# Patient Record
Sex: Female | Born: 1975 | Race: White | Hispanic: No | Marital: Married | Smoking: Never smoker
Health system: Southern US, Community
[De-identification: ages and names within clinical notes are randomized; demographics above are authoritative.]

## PROBLEM LIST (undated history)

## (undated) DIAGNOSIS — D869 Sarcoidosis, unspecified: Secondary | ICD-10-CM

## (undated) DIAGNOSIS — I10 Essential (primary) hypertension: Secondary | ICD-10-CM

## (undated) DIAGNOSIS — D693 Immune thrombocytopenic purpura: Secondary | ICD-10-CM

## (undated) DIAGNOSIS — O1420 HELLP syndrome (HELLP), unspecified trimester: Secondary | ICD-10-CM

## (undated) HISTORY — DX: HELLP syndrome (HELLP), unspecified trimester: O14.20

## (undated) HISTORY — PX: BONE MARROW BIOPSY: SHX199

## (undated) HISTORY — DX: Sarcoidosis, unspecified: D86.9

---

## 2000-09-11 ENCOUNTER — Encounter: Payer: Self-pay | Admitting: Thoracic Surgery

## 2000-09-13 ENCOUNTER — Ambulatory Visit (HOSPITAL_COMMUNITY): Admission: RE | Admit: 2000-09-13 | Discharge: 2000-09-13 | Payer: Self-pay | Admitting: Thoracic Surgery

## 2000-09-13 ENCOUNTER — Encounter (INDEPENDENT_AMBULATORY_CARE_PROVIDER_SITE_OTHER): Payer: Self-pay | Admitting: *Deleted

## 2001-02-27 ENCOUNTER — Other Ambulatory Visit: Admission: RE | Admit: 2001-02-27 | Discharge: 2001-02-27 | Payer: Self-pay | Admitting: Gynecology

## 2001-06-17 ENCOUNTER — Encounter: Payer: Self-pay | Admitting: *Deleted

## 2001-06-17 ENCOUNTER — Ambulatory Visit (HOSPITAL_COMMUNITY): Admission: RE | Admit: 2001-06-17 | Discharge: 2001-06-17 | Payer: Self-pay | Admitting: *Deleted

## 2002-04-10 ENCOUNTER — Other Ambulatory Visit: Admission: RE | Admit: 2002-04-10 | Discharge: 2002-04-10 | Payer: Self-pay | Admitting: Gynecology

## 2002-06-18 ENCOUNTER — Encounter: Payer: Self-pay | Admitting: Family Medicine

## 2002-06-18 ENCOUNTER — Ambulatory Visit (HOSPITAL_COMMUNITY): Admission: RE | Admit: 2002-06-18 | Discharge: 2002-06-18 | Payer: Self-pay | Admitting: Family Medicine

## 2002-11-02 ENCOUNTER — Other Ambulatory Visit: Admission: RE | Admit: 2002-11-02 | Discharge: 2002-11-02 | Payer: Self-pay | Admitting: Gynecology

## 2003-03-23 ENCOUNTER — Encounter: Admission: RE | Admit: 2003-03-23 | Discharge: 2003-03-23 | Payer: Self-pay | Admitting: Gynecology

## 2003-04-22 ENCOUNTER — Inpatient Hospital Stay (HOSPITAL_COMMUNITY): Admission: AD | Admit: 2003-04-22 | Discharge: 2003-04-22 | Payer: Self-pay | Admitting: Gynecology

## 2003-05-13 ENCOUNTER — Inpatient Hospital Stay (HOSPITAL_COMMUNITY): Admission: AD | Admit: 2003-05-13 | Discharge: 2003-05-18 | Payer: Self-pay | Admitting: Gynecology

## 2003-05-14 ENCOUNTER — Encounter (INDEPENDENT_AMBULATORY_CARE_PROVIDER_SITE_OTHER): Payer: Self-pay

## 2003-05-18 ENCOUNTER — Encounter: Admission: RE | Admit: 2003-05-18 | Discharge: 2003-06-17 | Payer: Self-pay | Admitting: Gynecology

## 2003-06-30 ENCOUNTER — Other Ambulatory Visit: Admission: RE | Admit: 2003-06-30 | Discharge: 2003-06-30 | Payer: Self-pay | Admitting: Gynecology

## 2004-02-04 ENCOUNTER — Ambulatory Visit (HOSPITAL_COMMUNITY): Admission: RE | Admit: 2004-02-04 | Discharge: 2004-02-04 | Payer: Self-pay | Admitting: Family Medicine

## 2004-06-30 ENCOUNTER — Other Ambulatory Visit: Admission: RE | Admit: 2004-06-30 | Discharge: 2004-06-30 | Payer: Self-pay | Admitting: Gynecology

## 2005-07-05 ENCOUNTER — Ambulatory Visit (HOSPITAL_COMMUNITY): Admission: RE | Admit: 2005-07-05 | Discharge: 2005-07-05 | Payer: Self-pay | Admitting: Family Medicine

## 2005-07-05 ENCOUNTER — Other Ambulatory Visit: Admission: RE | Admit: 2005-07-05 | Discharge: 2005-07-05 | Payer: Self-pay | Admitting: Gynecology

## 2005-07-10 ENCOUNTER — Ambulatory Visit (HOSPITAL_COMMUNITY): Admission: RE | Admit: 2005-07-10 | Discharge: 2005-07-10 | Payer: Self-pay | Admitting: Gynecology

## 2005-08-14 ENCOUNTER — Ambulatory Visit (HOSPITAL_COMMUNITY): Payer: Self-pay | Admitting: Oncology

## 2005-08-14 ENCOUNTER — Encounter: Admission: RE | Admit: 2005-08-14 | Discharge: 2005-09-07 | Payer: Self-pay | Admitting: Oncology

## 2005-08-14 ENCOUNTER — Encounter (HOSPITAL_COMMUNITY): Admission: RE | Admit: 2005-08-14 | Discharge: 2005-09-07 | Payer: Self-pay | Admitting: Oncology

## 2006-03-28 ENCOUNTER — Other Ambulatory Visit: Admission: RE | Admit: 2006-03-28 | Discharge: 2006-03-28 | Payer: Self-pay | Admitting: Gynecology

## 2006-08-13 ENCOUNTER — Encounter: Admission: RE | Admit: 2006-08-13 | Discharge: 2006-09-06 | Payer: Self-pay | Admitting: Oncology

## 2006-08-13 ENCOUNTER — Encounter (HOSPITAL_COMMUNITY): Admission: RE | Admit: 2006-08-13 | Discharge: 2006-09-06 | Payer: Self-pay | Admitting: Oncology

## 2006-08-13 ENCOUNTER — Ambulatory Visit (HOSPITAL_COMMUNITY): Payer: Self-pay | Admitting: Oncology

## 2006-09-10 ENCOUNTER — Encounter: Admission: RE | Admit: 2006-09-10 | Discharge: 2006-09-10 | Payer: Self-pay | Admitting: Oncology

## 2006-09-10 ENCOUNTER — Encounter (HOSPITAL_COMMUNITY): Admission: RE | Admit: 2006-09-10 | Discharge: 2006-10-10 | Payer: Self-pay | Admitting: Oncology

## 2006-10-01 ENCOUNTER — Ambulatory Visit (HOSPITAL_COMMUNITY): Payer: Self-pay | Admitting: Oncology

## 2006-10-10 ENCOUNTER — Inpatient Hospital Stay (HOSPITAL_COMMUNITY): Admission: AD | Admit: 2006-10-10 | Discharge: 2006-10-13 | Payer: Self-pay | Admitting: Gynecology

## 2006-10-14 ENCOUNTER — Encounter: Admission: RE | Admit: 2006-10-14 | Discharge: 2006-11-12 | Payer: Self-pay | Admitting: Gynecology

## 2006-11-21 ENCOUNTER — Other Ambulatory Visit: Admission: RE | Admit: 2006-11-21 | Discharge: 2006-11-21 | Payer: Self-pay | Admitting: Gynecology

## 2007-07-28 ENCOUNTER — Ambulatory Visit (HOSPITAL_COMMUNITY): Payer: Self-pay | Admitting: Oncology

## 2007-07-28 ENCOUNTER — Encounter (HOSPITAL_COMMUNITY): Admission: RE | Admit: 2007-07-28 | Discharge: 2007-08-27 | Payer: Self-pay | Admitting: Oncology

## 2007-12-16 ENCOUNTER — Encounter (HOSPITAL_COMMUNITY): Admission: RE | Admit: 2007-12-16 | Discharge: 2008-01-15 | Payer: Self-pay | Admitting: Oncology

## 2007-12-16 ENCOUNTER — Ambulatory Visit (HOSPITAL_COMMUNITY): Payer: Self-pay | Admitting: Oncology

## 2008-02-10 ENCOUNTER — Encounter (HOSPITAL_COMMUNITY): Admission: RE | Admit: 2008-02-10 | Discharge: 2008-03-11 | Payer: Self-pay | Admitting: Oncology

## 2008-02-10 ENCOUNTER — Ambulatory Visit (HOSPITAL_COMMUNITY): Payer: Self-pay | Admitting: Oncology

## 2008-04-08 ENCOUNTER — Encounter (HOSPITAL_COMMUNITY): Admission: RE | Admit: 2008-04-08 | Discharge: 2008-05-08 | Payer: Self-pay | Admitting: Oncology

## 2008-04-08 ENCOUNTER — Ambulatory Visit (HOSPITAL_COMMUNITY): Payer: Self-pay | Admitting: Oncology

## 2008-04-22 ENCOUNTER — Ambulatory Visit (HOSPITAL_COMMUNITY): Admission: RE | Admit: 2008-04-22 | Discharge: 2008-04-22 | Payer: Self-pay | Admitting: Family Medicine

## 2008-05-14 ENCOUNTER — Encounter (HOSPITAL_COMMUNITY): Admission: RE | Admit: 2008-05-14 | Discharge: 2008-05-14 | Payer: Self-pay | Admitting: Oncology

## 2008-05-27 ENCOUNTER — Inpatient Hospital Stay (HOSPITAL_COMMUNITY): Admission: AD | Admit: 2008-05-27 | Discharge: 2008-06-25 | Payer: Self-pay | Admitting: Obstetrics & Gynecology

## 2008-05-27 ENCOUNTER — Encounter: Payer: Self-pay | Admitting: Obstetrics & Gynecology

## 2008-06-01 ENCOUNTER — Encounter: Payer: Self-pay | Admitting: Obstetrics & Gynecology

## 2008-06-04 ENCOUNTER — Encounter: Payer: Self-pay | Admitting: Obstetrics & Gynecology

## 2008-06-08 ENCOUNTER — Encounter: Payer: Self-pay | Admitting: Obstetrics & Gynecology

## 2008-06-10 ENCOUNTER — Encounter: Payer: Self-pay | Admitting: Obstetrics & Gynecology

## 2008-06-15 ENCOUNTER — Encounter: Payer: Self-pay | Admitting: Obstetrics & Gynecology

## 2008-06-18 ENCOUNTER — Encounter: Payer: Self-pay | Admitting: Obstetrics & Gynecology

## 2008-06-22 ENCOUNTER — Encounter: Payer: Self-pay | Admitting: Obstetrics & Gynecology

## 2008-06-24 ENCOUNTER — Ambulatory Visit (HOSPITAL_COMMUNITY): Admission: RE | Admit: 2008-06-24 | Discharge: 2008-06-24 | Payer: Self-pay | Admitting: Obstetrics & Gynecology

## 2008-07-01 ENCOUNTER — Inpatient Hospital Stay (HOSPITAL_COMMUNITY): Admission: AD | Admit: 2008-07-01 | Discharge: 2008-07-03 | Payer: Self-pay | Admitting: Obstetrics and Gynecology

## 2008-07-01 ENCOUNTER — Encounter (INDEPENDENT_AMBULATORY_CARE_PROVIDER_SITE_OTHER): Payer: Self-pay | Admitting: Obstetrics & Gynecology

## 2008-09-06 ENCOUNTER — Ambulatory Visit (HOSPITAL_COMMUNITY): Payer: Self-pay | Admitting: Oncology

## 2008-09-06 ENCOUNTER — Encounter (HOSPITAL_COMMUNITY): Admission: RE | Admit: 2008-09-06 | Discharge: 2008-10-06 | Payer: Self-pay | Admitting: Oncology

## 2008-10-19 ENCOUNTER — Encounter (HOSPITAL_COMMUNITY): Admission: RE | Admit: 2008-10-19 | Discharge: 2008-11-18 | Payer: Self-pay | Admitting: Oncology

## 2008-11-11 ENCOUNTER — Ambulatory Visit (HOSPITAL_COMMUNITY): Payer: Self-pay | Admitting: Oncology

## 2008-11-29 ENCOUNTER — Encounter (HOSPITAL_COMMUNITY): Admission: RE | Admit: 2008-11-29 | Discharge: 2008-12-29 | Payer: Self-pay | Admitting: Oncology

## 2009-03-08 ENCOUNTER — Ambulatory Visit (HOSPITAL_COMMUNITY): Payer: Self-pay | Admitting: Oncology

## 2009-03-08 ENCOUNTER — Encounter (HOSPITAL_COMMUNITY): Admission: RE | Admit: 2009-03-08 | Discharge: 2009-04-08 | Payer: Self-pay | Admitting: Oncology

## 2009-05-20 ENCOUNTER — Ambulatory Visit (HOSPITAL_COMMUNITY): Payer: Self-pay | Admitting: Oncology

## 2009-05-20 ENCOUNTER — Encounter (HOSPITAL_COMMUNITY): Admission: RE | Admit: 2009-05-20 | Discharge: 2009-06-19 | Payer: Self-pay | Admitting: Oncology

## 2010-01-15 IMAGING — US US FETAL BPP W/O NONSTRESS
1 series · 14 of 21 positions shown · non-contrast
Comparison: none

OBSTETRICAL ULTRASOUND:
 This ultrasound was performed in The [HOSPITAL], and the [facility] report will be stored to [REDACTED] PACS.

[Series 1: us fetal bpp w/o nonstress · 14 of 21 slices shown]
[im 1/21]
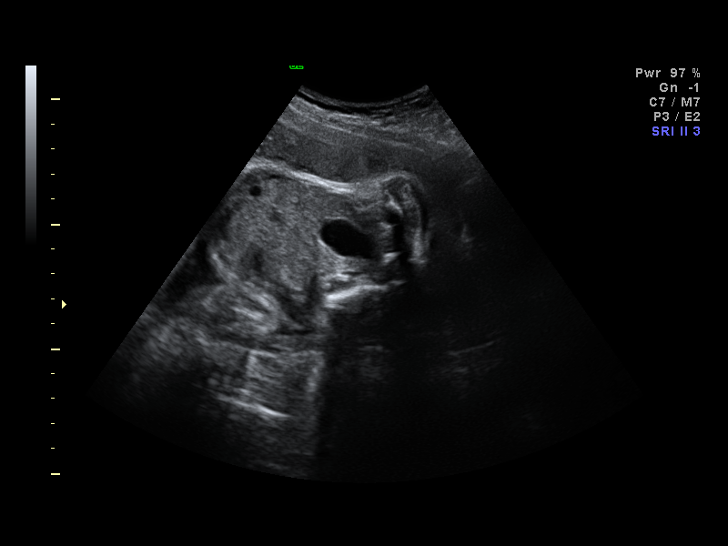
[im 3/21]
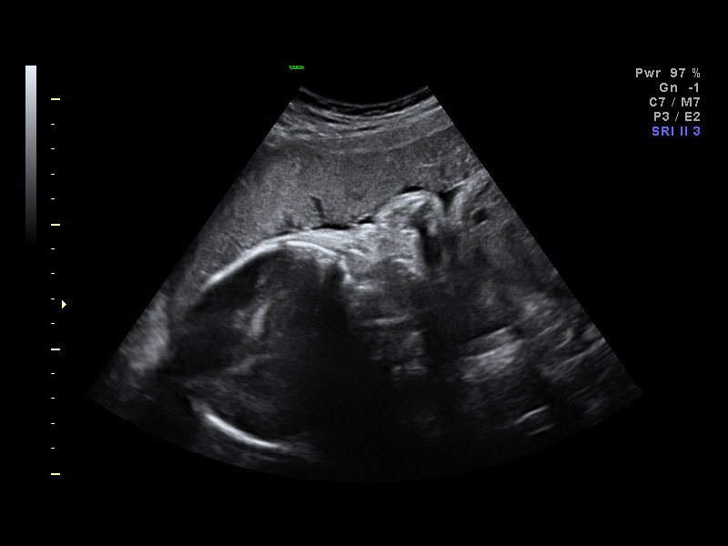
[im 4/21]
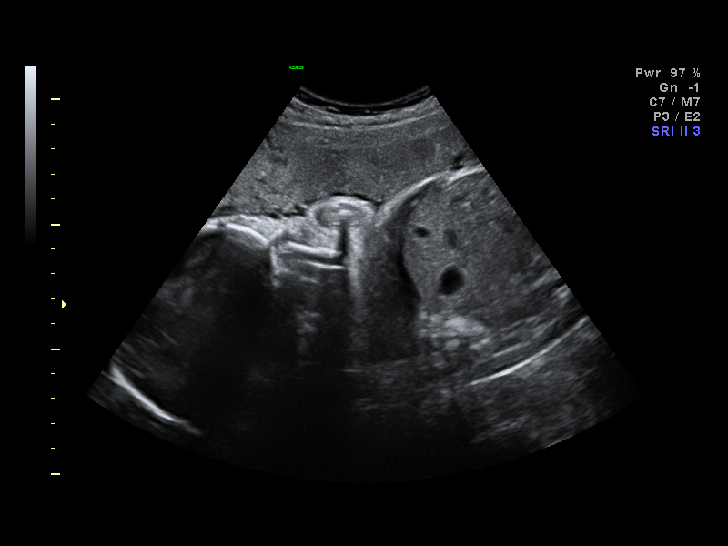
[im 6/21]
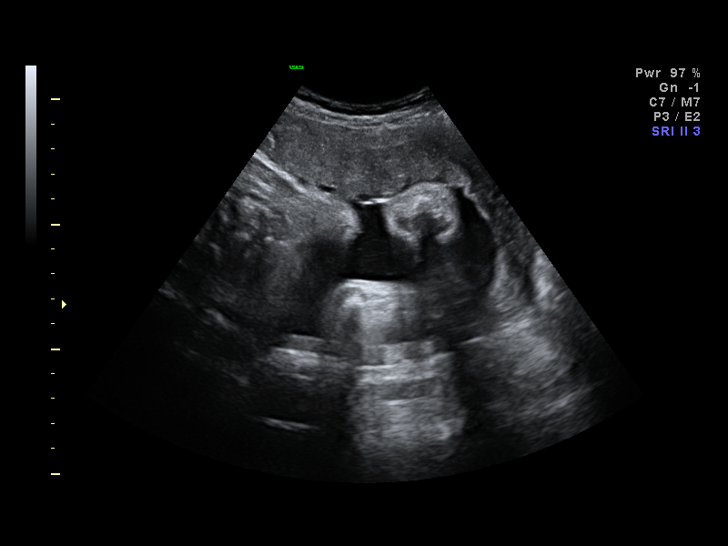
[im 7/21]
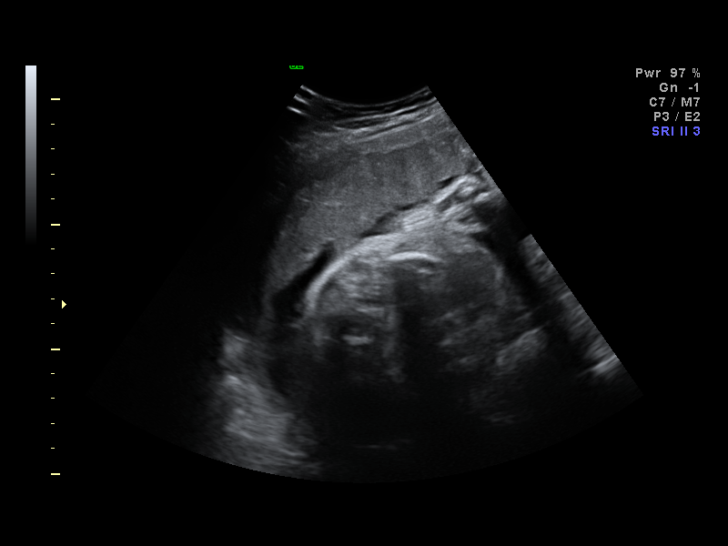
[im 9/21]
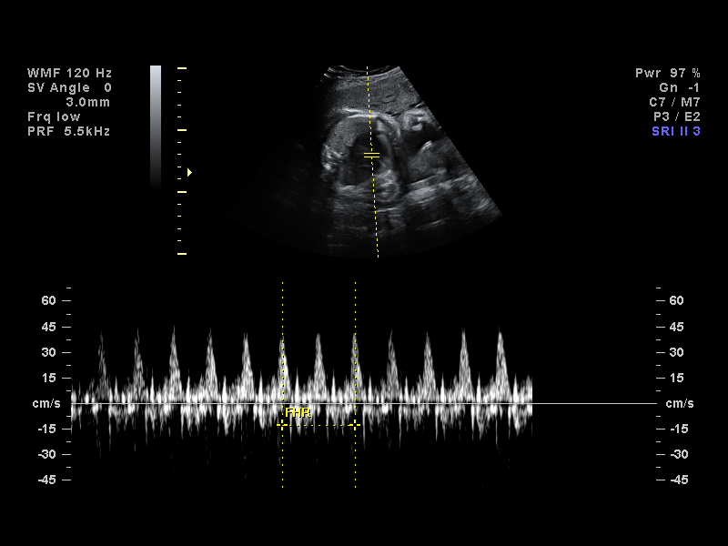
[im 10/21]
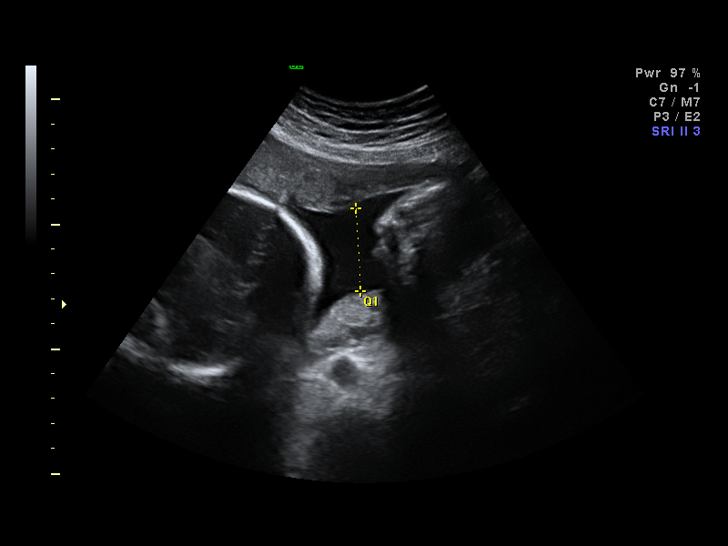
[im 12/21]
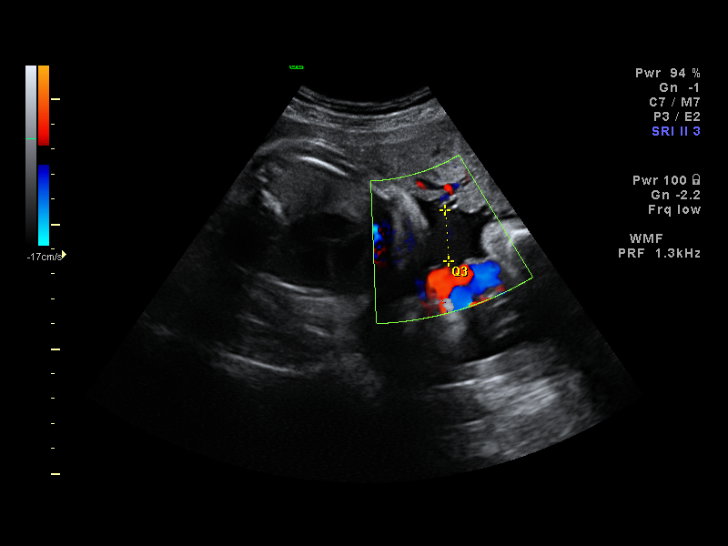
[im 13/21]
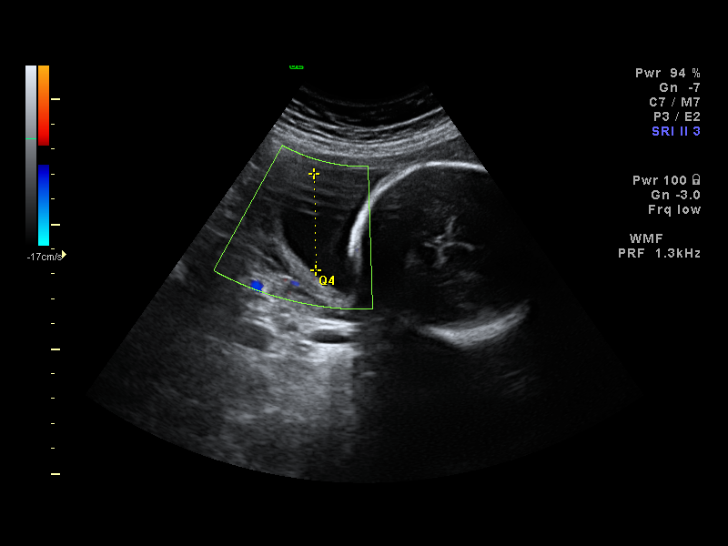
[im 15/21]
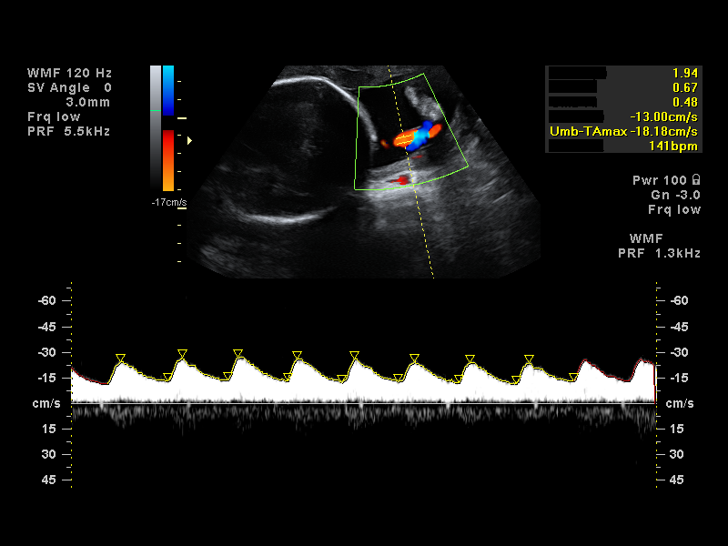
[im 16/21]
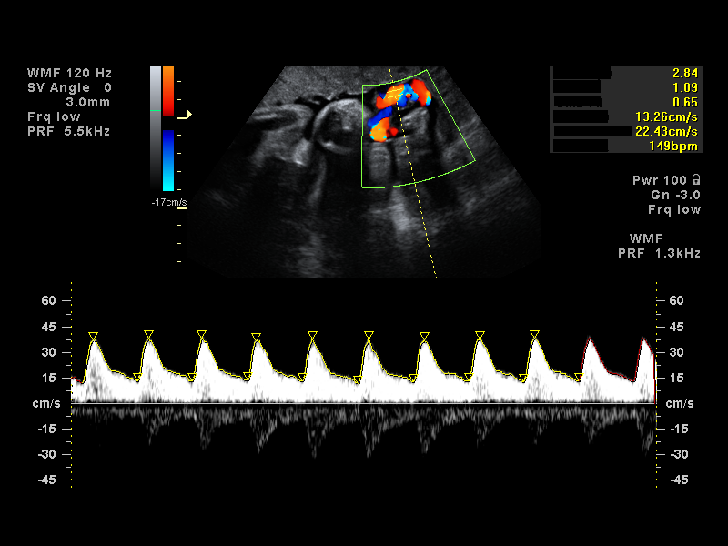
[im 18/21]
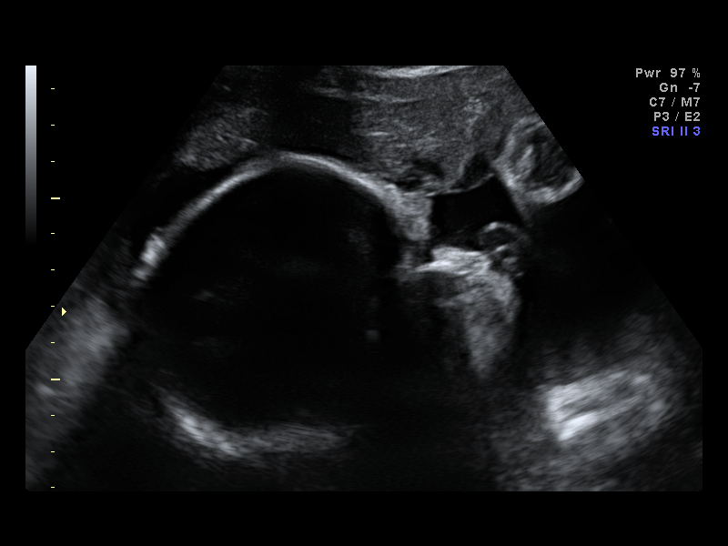
[im 19/21]
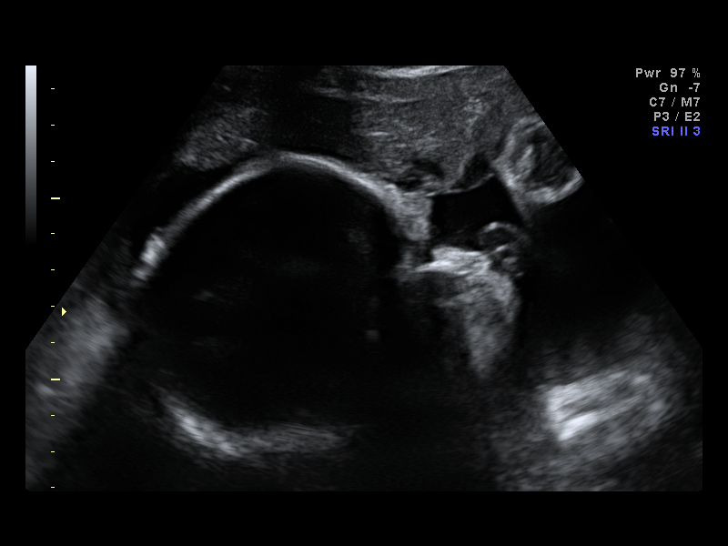
[im 21/21]
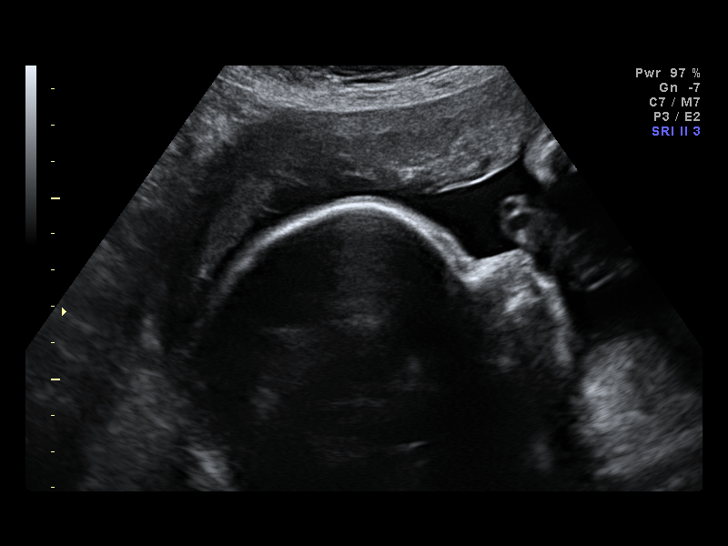

[14 of 21 positions shown; findings below may reference images not displayed]

IMPRESSION: [facility] has also been faxed to the ordering physician.

## 2010-04-26 ENCOUNTER — Ambulatory Visit (HOSPITAL_COMMUNITY): Payer: Self-pay | Admitting: Oncology

## 2010-04-26 ENCOUNTER — Encounter (HOSPITAL_COMMUNITY): Admission: RE | Admit: 2010-04-26 | Discharge: 2010-05-26 | Payer: Self-pay | Admitting: Oncology

## 2010-06-13 ENCOUNTER — Ambulatory Visit (HOSPITAL_COMMUNITY): Payer: Self-pay | Admitting: Oncology

## 2010-07-19 ENCOUNTER — Ambulatory Visit (HOSPITAL_COMMUNITY): Admission: RE | Admit: 2010-07-19 | Discharge: 2010-07-19 | Payer: Self-pay | Admitting: Family Medicine

## 2010-11-28 ENCOUNTER — Encounter (HOSPITAL_COMMUNITY)
Admission: RE | Admit: 2010-11-28 | Discharge: 2010-12-28 | Payer: Self-pay | Source: Home / Self Care | Attending: Oncology | Admitting: Oncology

## 2010-11-28 ENCOUNTER — Ambulatory Visit (HOSPITAL_COMMUNITY): Payer: Self-pay | Admitting: Oncology

## 2010-12-31 ENCOUNTER — Encounter: Payer: Self-pay | Admitting: Family Medicine

## 2011-01-01 ENCOUNTER — Encounter: Payer: Self-pay | Admitting: Obstetrics & Gynecology

## 2011-02-19 LAB — CBC
HCT: 40.5 % (ref 36.0–46.0)
Platelets: 194 10*3/uL (ref 150–400)
WBC: 9.3 10*3/uL (ref 4.0–10.5)

## 2011-02-19 LAB — FERRITIN: Ferritin: 55 ng/mL (ref 10–291)

## 2011-02-26 LAB — CBC
Hemoglobin: 15.1 g/dL — ABNORMAL HIGH (ref 12.0–15.0)
Platelets: 157 10*3/uL (ref 150–400)
RDW: 13.2 % (ref 11.5–15.5)
WBC: 9.3 10*3/uL (ref 4.0–10.5)

## 2011-02-26 LAB — DIFFERENTIAL
Basophils Absolute: 0 10*3/uL (ref 0.0–0.1)
Lymphs Abs: 1.2 10*3/uL (ref 0.7–4.0)
Monocytes Absolute: 0.3 10*3/uL (ref 0.1–1.0)
Neutro Abs: 7.7 10*3/uL (ref 1.7–7.7)
Neutrophils Relative %: 83 % — ABNORMAL HIGH (ref 43–77)

## 2011-03-19 LAB — CBC
MCV: 86.8 fL (ref 78.0–100.0)
Platelets: 155 10*3/uL (ref 150–400)
RBC: 4.56 MIL/uL (ref 3.87–5.11)
WBC: 6.7 10*3/uL (ref 4.0–10.5)

## 2011-03-21 LAB — DIFFERENTIAL
Basophils Relative: 0 % (ref 0–1)
Eosinophils Relative: 1 % (ref 0–5)
Lymphocytes Relative: 22 % (ref 12–46)
Lymphs Abs: 1.4 10*3/uL (ref 0.7–4.0)
Monocytes Absolute: 0.5 10*3/uL (ref 0.1–1.0)
Neutro Abs: 4.6 10*3/uL (ref 1.7–7.7)
Neutrophils Relative %: 70 % (ref 43–77)

## 2011-03-21 LAB — CBC
Hemoglobin: 14.5 g/dL (ref 12.0–15.0)
MCV: 84.9 fL (ref 78.0–100.0)
RDW: 12.3 % (ref 11.5–15.5)

## 2011-03-22 LAB — CBC
Hemoglobin: 14.2 g/dL (ref 12.0–15.0)
RBC: 4.77 MIL/uL (ref 3.87–5.11)
WBC: 9.8 10*3/uL (ref 4.0–10.5)

## 2011-03-22 LAB — DIFFERENTIAL
Basophils Absolute: 0 10*3/uL (ref 0.0–0.1)
Eosinophils Relative: 1 % (ref 0–5)
Lymphocytes Relative: 16 % (ref 12–46)
Lymphs Abs: 1.5 10*3/uL (ref 0.7–4.0)
Monocytes Relative: 6 % (ref 3–12)

## 2011-03-26 LAB — CBC
Hemoglobin: 14.2 g/dL (ref 12.0–15.0)
MCHC: 33.9 g/dL (ref 30.0–36.0)
RBC: 4.77 MIL/uL (ref 3.87–5.11)

## 2011-03-26 LAB — DIFFERENTIAL
Basophils Relative: 0 % (ref 0–1)
Lymphs Abs: 1.4 10*3/uL (ref 0.7–4.0)
Monocytes Relative: 9 % (ref 3–12)

## 2011-04-24 NOTE — Discharge Summary (Signed)
NAME:  Emma Smith, Emma Smith                 ACCOUNT NO.:  [id number]   MEDICAL RECORD NO.:  [id number]          PATIENT TYPE:  INP   LOCATION:                                FACILITY:  [facility]   PHYSICIAN:  Genia Del, M.D.DATE OF BIRTH:  07-Aug-1976   DATE OF ADMISSION:  05/27/2008  DATE OF DISCHARGE:  06/25/2008                               DISCHARGE SUMMARY   ADMISSION DIAGNOSES:  1. A 31 and 6/7 weeks' intrauterine growth restriction less than 10th      percentile.  2. Chronic hypertension.  3. ITP.   DISCHARGE DIAGNOSES:  1. A 31 and 6/7 weeks' intrauterine growth restriction less than 10th      percentile.  2. Chronic hypertension.  3. ITP.  4. Well-controlled chronic hypertension and reassuring fetal well      being with good interval growth.   HOSPITAL COURSE:  The patient was admitted and put on bedrest with a  fetal monitoring.  An ultrasound was done at admission showing an  intrauterine growth restriction less than the 10th percentile with  abnormal amniotic fluid index.  The patient received betamethasone x2.  She had a well-controlled chronic hypertension with blood pressures  within normal and no evidence of preeclampsia.  A consult with MFM was  done.  They had reassuring Dopplers and amniotic fluid index,  biophysical profile were 8/8, and they decided to follow her up with  Dopplers and amniotic fluid index twice a week and continue her beta-  blocker for her chronic hypertension.  They advised to recheck on growth  10 days later at followup ultrasound for growth.  There was a good  interval growth, but the baby was still at less than the 10th  percentile.  The AFI was still normal and Dopplers were normal and NST  was reactive.  The patient was therefore discharged home and would be  followed by biophysical profiles and Dopplers with interval growth  ultrasound.       Genia Del, M.D.  Electronically Signed     ML/MEDQ  D:  08/29/2008  T:   08/30/2008  Job:  [id number]

## 2011-04-24 NOTE — H&P (Signed)
Emma Smith, Emma Smith                 ACCOUNT NO.:  [id number]   MEDICAL RECORD NO.:  [id number]          PATIENT TYPE:  OUT   LOCATION:  MFM                           FACILITY:  [facility]   PHYSICIAN:  Lenoard Aden, M.D.DATE OF BIRTH:  [birth year]   DATE OF ADMISSION:  05/27/2008  DATE OF DISCHARGE:                              HISTORY & PHYSICAL   CHIEF COMPLAINT:  IUGR.   She is a 35 year old female G3, P2 with history of vaginal delivery x2  who presents now at [redacted] weeks gestation with an estimated fetal weight of  less than the 5th percentile for admission monitoring betamethasone on  consultation.   ALLERGIES:  No known drug allergies.   MEDICATIONS:  Acebutolol for hypertension and prenatal vitamins.   She has personal history of chronic hypertension, gestational diabetes  with her first pregnancy, questionable sarcoidosis, and a history of  HELPP versus IPP with her previous pregnancy.  She has history of 2  vaginal deliveries one complicated by ICU admission.   FAMILY HISTORY:  Hypertension, myocardial infarction, diabetes, kidney  disease, __________.   Prenatal course complicated by elevated blood pressures on medications  as noted and an ultrasound __________ followed up by repeat ultrasound  revealing probable IUGR.   PHYSICAL EXAMINATION:  GENERAL:  She is a well-developed, well-nourished  female in no acute distress.  HEENT:  Normal.  LUNGS:  Clear.  HEART:  Regular rate and rhythm.  ABDOMEN:  Soft, gravid, and nontender.  Estimated fetal weight by  ultrasound 2 pounds 8 ounces.  EXTREMITIES:  No cords.  NEUROLOGIC:  Nonfocal.  SKIN:  Intact.   IMPRESSION:  A 32-week OB to symmetrical IUGR with normal amniotic fluid  volume.   PLAN:  To admit betamethasone, continuous monitoring at this time,  maternal fetal consult __________ anticipate inpatient management at  this time.      Lenoard Aden, M.D.  Electronically Signed    RJT/MEDQ  D:   05/27/2008  T:  05/28/2008  Job:  [id number]

## 2011-04-24 NOTE — Consult Note (Signed)
NAME:  Emma Smith, Emma Smith                 ACCOUNT NO.:  [id number]   MEDICAL RECORD NO.:  [id number]          PATIENT TYPE:  OUT   LOCATION:  MFM                           [facility]ACILITY:  [facility]   PHYSICIAN:  Toma Copier, MD           DATE OF BIRTH:  1976-03-23   DATE OF CONSULTATION:  DATE OF DISCHARGE:                                 CONSULTATION   REFERRING PHYSICIAN:  Genia Del, M.D.   INDICATIONS:  The patient is a 35 year old, gravida 3, para 1-1-0-2,  currently at 32 weeks and 0 days, admitted secondary to intrauterine  growth restriction.   HISTORY OF PRESENT ILLNESS:  As mentioned, the patient is a 35 year old  with a history of ITP, idiopathic thrombocytopenic purpura, that has  been stable and is followed with a hematologist as well as chronic  hypertension for 5 years treated with acebutolol 200 mg daily and was  noted to have asymmetric IGUR over the course of the past few weeks of  pregnancy.  During her office visit today, it was noted that she had  continuing decline in her growth restricted fetus, less than the 10th  percentile and thus was admitted to the hospital for further evaluation.  She is otherwise without complaints, denies any headaches, visual  changes, or right upper quadrant pain, denies any nausea or vomiting,  denies any contractions or vaginal bleeding and reports good fetal  movement.   PAST MEDICAL HISTORY:  As noted above.  ITP for which she sees Dr. Glenford Peers and has currently been stable.  She also has sarcoidosis and  has never been treated.  She also was noted to have chronic hypertension  as well as preeclampsia and HELLP syndrome with her first pregnancy.  She has had chronic hypertension for 5 years.   MEDICATIONS:  Acebutolol and prenatal vitamin.   PAST SURGICAL HISTORY:  Negative.   ALLERGIES:  No known drug allergies and no known latex allergy.   SOCIAL HISTORY:  Negative for alcohol, tobacco or drugs of abuse.   OBSTETRICAL  HISTORY:  G1 delivery in 2004 at 36 plus weeks with  preeclampsia and HELLP syndrome.  Birth weight was 5 pounds, 7 ounces.  Second delivery was in November 2007, term, complicated with chronic  hypertension.   Her ultrasound on Apr 29, 2008 revealed an AGE fetus of 35%, but the  abdominal circumference was measuring 2 weeks behind.  Her Down syndrome  screen and quad screen was screened negative for epineural tube defect,  Down syndrome and trisomy-18.   Ultrasound evaluation today reveals amniotic fluid volume of 9.7 which  is at the 14th percentile.  The fetus is in the breech presentation.  The estimated fetal weight is 1180 grams which is less than the 10th  percentile and the fetus measures an ultrasound gestational age of [redacted]  weeks and 1 day.  Umbilical artery Dopplers are normal with the systolic  to diastolic ratio of 1.5 which is less than the 5th percentile.  There  is no absent or reverse flow.  The cervical  length measures 3.1-cm.   ASSESSMENT/PLAN:  1. Intrauterine pregnancy at [redacted] weeks gestation with intrauterine      growth restriction noted:  IUGR umbilical artery Dopplers and      amniotic fluid volume are reassuring at this time. I recommend that      we repeat amniotic fluid volume and Doppler evaluations 2 times per      week.  I also recommend that we repeat an interval growth scan in      approximately 10 to 14 days.  Given her history of preeclampsia and      HELLP syndrome, I would recommend that she have some preeclampsia      evaluation done with serum and a 24-hour urine collection.  Fetal      monitoring should include at least a nonstress while hospitalized.  2. Chronic hypertension:  Recommend continuation on her routine      antihypertensive of acebutolol 200 mg daily.  3. Idiopathic thrombocytopenic purpura has been stable, but would      recommend assessing current platelet count.  4. Indications for fetal delivery:  If there is any evidence of  fetal      distress or evidence of preeclampsia given the growth restrictive      status or any symptoms related to severe preeclampsia, I would      proceed to delivery.  I stand available to reassess at any time.      Please feel free to contact me with any questions related to      delivery plan.   Thank you very much for allowing me to participate in the care of Ms.  Rickelle Gillihan.  This consultation was per your request and total face to  face time was 45 minutes. If you have any further questions, please feel  free to contact me.  During the interval, I can be reached for any other  followup and we will continue to follow her during this hospitalization.      Toma Copier, MD  Electronically Signed     SJ/MEDQ  D:  05/27/2008  T:  05/27/2008  Job:  [id number]   cc:   Genia Del, M.D.  Fax: [phone]

## 2011-04-24 NOTE — Op Note (Signed)
NAME:  Emma Smith, Emma Smith                 ACCOUNT NO.:  [id number]   MEDICAL RECORD NO.:  [id number]          PATIENT TYPE:  INP   LOCATION:  9303                          [facility]ACILITY:  [facility]   PHYSICIAN:  Genia Del, M.D.DATE OF BIRTH:  1976-04-28   DATE OF PROCEDURE:  DATE OF DISCHARGE:                               OPERATIVE REPORT   PREOPERATIVE DIAGNOSES:  A [redacted] weeks gestation, intrauterine growth  restriction less than 3rd percentile, positive stress test, unfavorable  cervix, and desire for sterilization.   POSTOPERATIVE DIAGNOSES:  A [redacted] weeks gestation, intrauterine growth  restriction less than 3rd percentile, positive stress test, unfavorable  cervix, and desire for sterilization.   PROCEDURE:  Primary elective low-transverse C-section with bilateral  tubal sterilization by modified Pomeroy procedure.   SURGEON:  Genia Del, MD.   ASSISTANT:  Lenoard Aden, MD.   ANESTHESIOLOGIST:  Octaviano Glow. Pamalee Leyden, MD.   PROCEDURE:  Under spinal anesthesia with 15-degree left decubitus  position, the patient is draped.  The patient is prepped with Betadine  on the abdominal, suprapubic, vulvar, and vaginal areas.  The Foley  catheter is inserted in the bladder.  We then draped the patient as  usual.  The level of anesthesia is verified and is adequate.  We then  infiltrate the subcutaneous tissue with Marcaine one-quarter plain 10  mL.  We made a Pfannenstiel incision with a scalpel.  Opened the adipose  tissue with the scalpel and the electrocautery.  Opened the aponeurosis  transversely with the electrocautery and the Mayo scissors.  We  separated the recti muscles from the aponeurosis on the midline.  We  then opened the parietal peritoneum longitudinally with [facility] scissors.  We then inserted the bladder retractor.  The visceral peritoneum was  opened transversely with Metz scissors over the lower uterine segment.  The bladder is reclined downward and the bladder retractor  is  repositioned.  We then made a low transverse hysterotomy with a scalpel.  We extend on each side with dressing scissors.  The amniotic fluid is  clear.  The fetus is in cephalic presentation.  Birth of a baby boy at  2059. The baby is suctioned.  The cord is clamped and cut.  The baby is  given to the neonatal team.  PH is 7.3, Apgars are 9 and 9, weight is 3  pounds and 13 ounces.  The placenta is removed spontaneously and sent to  Pathology.  The cord blood is taken.  We then do a uterine revision.  Pitocin was started in the IV fluid.  The uterus contracts well.  A dose  of Ancef 1 g IV is given after cord clamping.  We closed the hysterotomy  in a first full plane lock suture of Vicryl 0.  We then do a figure-of-  eight on the midline to complete hemostasis, and do a second plane with  Vicryl 0 in a mattress stitch.  We verified hemostasis at all levels, it  is adequate.  We then proceed with the bilateral tubal sterilization by  modified Pomeroy technique.  We started on  the left tube.  We made a  window in the mesosalpinx with the electrocautery.  We used plain suture  to ligate the tube proximally at about 2-cm from the cornua and then  distal to that, we ligated the tube again.  We cut about 1-cm of tube in  between and send it to Pathology.  We cauterized each cut ends of the  tube.  We proceed exactly the same way on the right side.  Hemostasis is  adequate at those levels.  Both ovaries are normal to inspection, in  size and appearance.  We then re-verified the hemostasis on the  hysterotomy, it is adequate.  The abdominopelvic cavities are irrigated  and suctioned.  We verified hemostasis on the recti muscles and bladder  flap and it is adequate.  We closed the aponeurosis in two-half running  sutures of Vicryl 0.  Hemostasis was completed on the adipose tissue  with the  electrocautery, and the skin was reapproximated with staples.  The count  of instruments and sponges  was complete.  A dry dressing was applied on  the incision.  The estimated blood loss was 600 mL.  No complications  occurred and the patient was transferred to recovery room in good stable  status.      Genia Del, M.D.  Electronically Signed     ML/MEDQ  D:  07/01/2008  T:  07/02/2008  Job:  [id number]

## 2011-04-24 NOTE — Discharge Summary (Signed)
NAME:  Emma Smith, CEASAR                 ACCOUNT NO.:  [id number]   MEDICAL RECORD NO.:  [id number]          PATIENT TYPE:  INP   LOCATION:                                [facility]   PHYSICIAN:  Genia Del, M.D.DATE OF BIRTH:  [birth year]   DATE OF ADMISSION:  05/27/2008  DATE OF DISCHARGE:  06/25/2008                               DISCHARGE SUMMARY   ADMISSION DIAGNOSES:  1. A 31 and 6/7 weeks' intrauterine growth restriction less than 10th      percentile.  2. Chronic hypertension.  3. ITP.   DISCHARGE DIAGNOSES:  1. A 31 and 6/7 weeks' intrauterine growth restriction less than 10th      percentile.  2. Chronic hypertension.  3. ITP.  4. Well-controlled chronic hypertension and reassuring fetal well      being with good interval growth.   HOSPITAL COURSE:  The patient was admitted and put on bedrest with a  fetal monitoring.  An ultrasound was done at admission showing an  intrauterine growth restriction less than the 10th percentile with  abnormal amniotic fluid index.  The patient received betamethasone x2.  She had a well-controlled chronic hypertension with blood pressures  within normal and no evidence of preeclampsia.  A consult with MFM was  done.  They had reassuring Dopplers and amniotic fluid index,  biophysical profile were 8/8, and they decided to follow her up with  Dopplers and amniotic fluid index twice a week and continue her beta-  blocker for her chronic hypertension.  They advised to recheck on growth  10 days later at followup ultrasound for growth.  There was a good  interval growth, but the baby was still at less than the 10th  percentile.  The AFI was still normal and Dopplers were normal and NST  was reactive.  The patient was therefore discharged home and would be  followed by biophysical profiles and Dopplers with interval growth  ultrasound.       Genia Del, M.D.     ML/MEDQ  D:  08/29/2008  T:  08/30/2008  Job:  [id number]

## 2011-05-18 ENCOUNTER — Other Ambulatory Visit: Payer: Self-pay | Admitting: Obstetrics & Gynecology

## 2011-06-11 ENCOUNTER — Other Ambulatory Visit (HOSPITAL_COMMUNITY): Payer: Self-pay | Admitting: Oncology

## 2011-06-11 ENCOUNTER — Encounter (HOSPITAL_COMMUNITY): Payer: BC Managed Care – PPO | Attending: Oncology

## 2011-06-11 DIAGNOSIS — D693 Immune thrombocytopenic purpura: Secondary | ICD-10-CM

## 2011-06-11 LAB — CBC
HCT: 40.3 % (ref 36.0–46.0)
MCH: 29.7 pg (ref 26.0–34.0)
MCV: 86.9 fL (ref 78.0–100.0)
RBC: 4.64 MIL/uL (ref 3.87–5.11)
WBC: 5.2 10*3/uL (ref 4.0–10.5)

## 2011-06-12 ENCOUNTER — Encounter (HOSPITAL_COMMUNITY): Payer: BC Managed Care – PPO | Admitting: Oncology

## 2011-06-12 DIAGNOSIS — D693 Immune thrombocytopenic purpura: Secondary | ICD-10-CM

## 2011-08-24 ENCOUNTER — Other Ambulatory Visit (HOSPITAL_COMMUNITY): Payer: Self-pay | Admitting: Family Medicine

## 2011-08-24 ENCOUNTER — Ambulatory Visit (HOSPITAL_COMMUNITY)
Admission: RE | Admit: 2011-08-24 | Discharge: 2011-08-24 | Disposition: A | Discharge status: Home / Self Care | Payer: BC Managed Care – PPO | Source: Ambulatory Visit | Attending: Family Medicine | Admitting: Family Medicine

## 2011-08-24 DIAGNOSIS — Z01419 Encounter for gynecological examination (general) (routine) without abnormal findings: Secondary | ICD-10-CM

## 2011-08-24 DIAGNOSIS — I10 Essential (primary) hypertension: Secondary | ICD-10-CM | POA: Insufficient documentation

## 2011-08-24 DIAGNOSIS — Z0000 Encounter for general adult medical examination without abnormal findings: Secondary | ICD-10-CM

## 2011-08-24 DIAGNOSIS — Z139 Encounter for screening, unspecified: Secondary | ICD-10-CM

## 2011-08-24 DIAGNOSIS — D869 Sarcoidosis, unspecified: Secondary | ICD-10-CM | POA: Insufficient documentation

## 2011-08-29 ENCOUNTER — Ambulatory Visit (HOSPITAL_COMMUNITY)
Admission: RE | Admit: 2011-08-29 | Discharge: 2011-08-29 | Disposition: A | Discharge status: Home / Self Care | Payer: BC Managed Care – PPO | Source: Ambulatory Visit | Attending: Family Medicine | Admitting: Family Medicine

## 2011-08-29 DIAGNOSIS — Z0000 Encounter for general adult medical examination without abnormal findings: Secondary | ICD-10-CM

## 2011-08-29 DIAGNOSIS — I10 Essential (primary) hypertension: Secondary | ICD-10-CM | POA: Insufficient documentation

## 2011-08-29 DIAGNOSIS — Z139 Encounter for screening, unspecified: Secondary | ICD-10-CM

## 2011-08-29 DIAGNOSIS — Z13820 Encounter for screening for osteoporosis: Secondary | ICD-10-CM | POA: Insufficient documentation

## 2011-08-29 DIAGNOSIS — Z01419 Encounter for gynecological examination (general) (routine) without abnormal findings: Secondary | ICD-10-CM

## 2011-08-31 LAB — CBC
MCHC: 35.6
MCV: 85.2
Platelets: 202
RDW: 13.6

## 2011-09-03 LAB — CBC
MCV: 85.8
Platelets: 255
RBC: 4.38
WBC: 8.5

## 2011-09-04 LAB — CBC
HCT: 35.5 — ABNORMAL LOW
HCT: 36.5
Hemoglobin: 13
MCV: 87.4
MCV: 89.5
Platelets: 200
Platelets: 200
RBC: 4.06
RDW: 14.1
WBC: 9.8

## 2011-09-06 LAB — COMPREHENSIVE METABOLIC PANEL
ALT: 15
ALT: 15
ALT: 15
AST: 15
AST: 16
Albumin: 2.6 — ABNORMAL LOW
Alkaline Phosphatase: 113
Alkaline Phosphatase: 116
BUN: 10
CO2: 19
CO2: 25
Calcium: 8.9
Chloride: 101
Chloride: 103
Chloride: 106
Creatinine, Ser: 0.47
Creatinine, Ser: 0.53
GFR calc Af Amer: >60
GFR calc Af Amer: >60
GFR calc non Af Amer: >60
GFR calc non Af Amer: >60
Glucose, Bld: 196 — ABNORMAL HIGH
Potassium: 3.4 — ABNORMAL LOW
Potassium: 3.5
Sodium: 136
Sodium: 136
Sodium: 136
Total Bilirubin: 0.6
Total Bilirubin: 0.7
Total Bilirubin: 0.7
Total Protein: 5.1 — ABNORMAL LOW
Total Protein: 5.3 — ABNORMAL LOW

## 2011-09-06 LAB — CBC
HCT: 35.8 — ABNORMAL LOW
HCT: 40.9
Hemoglobin: 12.8
MCHC: 35.9
MCHC: 35.4
MCV: 90.9
MCV: 92.2
Platelets: 196
Platelets: 209
Platelets: 213
RBC: 3.96
RBC: 4.5
RDW: 13.7
RDW: 13.2
WBC: 13.9 — ABNORMAL HIGH
WBC: 16.9 — ABNORMAL HIGH

## 2011-09-06 LAB — CREATININE CLEARANCE, URINE, 24 HOUR: Urine Total Volume-CRCL: 2700

## 2011-09-06 LAB — PROTEIN, URINE, 24 HOUR
Collection Interval-UPROT: 24
Protein, Urine: 3

## 2011-09-06 LAB — URIC ACID: Uric Acid, Serum: 4.2

## 2011-09-07 LAB — CBC
HCT: 35.8 — ABNORMAL LOW
Hemoglobin: 13.9
MCHC: 34.6
MCV: 91.1
MCV: 92
Platelets: 171
RBC: 3.89
RBC: 4.48
WBC: 12.2 — ABNORMAL HIGH

## 2011-09-07 LAB — COMPREHENSIVE METABOLIC PANEL
AST: 16
CO2: 25
Calcium: 8.8
Creatinine, Ser: 0.56
GFR calc Af Amer: >60
GFR calc non Af Amer: >60

## 2011-09-07 LAB — STREP B DNA PROBE

## 2011-09-10 LAB — CBC
HCT: 41.5
HCT: 42.1
HCT: 45.4
HCT: 46.6 — ABNORMAL HIGH
Hemoglobin: 14.8
Hemoglobin: 14.3
Hemoglobin: 15.3 — ABNORMAL HIGH
Hemoglobin: 16.1 — ABNORMAL HIGH
MCHC: 34.5
MCHC: 33.6
MCHC: 33.7
MCHC: 34.3
MCV: 87.8
MCV: 88.9
MCV: 89.8
Platelets: 30 — CL
Platelets: 8 — CL
Platelets: 178
RBC: 5.06
RBC: 5.3 — ABNORMAL HIGH
RDW: 12.1
RDW: 12.5
RDW: 12.1
RDW: 13.2
WBC: 5.7
WBC: 11.2 — ABNORMAL HIGH
WBC: 12.9 — ABNORMAL HIGH
WBC: 9.4

## 2011-09-11 LAB — CBC
Platelets: 227 10*3/uL (ref 150–400)
RDW: 13.5 % (ref 11.5–15.5)
WBC: 9.6 10*3/uL (ref 4.0–10.5)

## 2011-09-14 LAB — CBC
HCT: 39.3 % (ref 36.0–46.0)
HCT: 42.2 % (ref 36.0–46.0)
Hemoglobin: 13.6 g/dL (ref 12.0–15.0)
Hemoglobin: 14.4 g/dL (ref 12.0–15.0)
MCHC: 34 g/dL (ref 30.0–36.0)
RBC: 4.75 MIL/uL (ref 3.87–5.11)
RDW: 12.6 % (ref 11.5–15.5)
RDW: 13.2 % (ref 11.5–15.5)
WBC: 5.2 10*3/uL (ref 4.0–10.5)

## 2011-09-21 LAB — CBC
Hemoglobin: 14.5
MCHC: 34.3
MCV: 86.5
RBC: 4.89
RDW: 12.1

## 2012-04-28 ENCOUNTER — Emergency Department (HOSPITAL_COMMUNITY)
Admission: EM | Admit: 2012-04-28 | Discharge: 2012-04-28 | Disposition: A | Discharge status: Home / Self Care | Payer: BC Managed Care – PPO | Attending: Emergency Medicine | Admitting: Emergency Medicine

## 2012-04-28 ENCOUNTER — Encounter (HOSPITAL_COMMUNITY): Payer: Self-pay

## 2012-04-28 DIAGNOSIS — R000 Tachycardia, unspecified: Secondary | ICD-10-CM | POA: Insufficient documentation

## 2012-04-28 DIAGNOSIS — M545 Low back pain, unspecified: Secondary | ICD-10-CM | POA: Insufficient documentation

## 2012-04-28 DIAGNOSIS — N949 Unspecified condition associated with female genital organs and menstrual cycle: Secondary | ICD-10-CM | POA: Insufficient documentation

## 2012-04-28 DIAGNOSIS — I10 Essential (primary) hypertension: Secondary | ICD-10-CM | POA: Insufficient documentation

## 2012-04-28 DIAGNOSIS — N760 Acute vaginitis: Secondary | ICD-10-CM

## 2012-04-28 DIAGNOSIS — L989 Disorder of the skin and subcutaneous tissue, unspecified: Secondary | ICD-10-CM

## 2012-04-28 DIAGNOSIS — Z79899 Other long term (current) drug therapy: Secondary | ICD-10-CM | POA: Insufficient documentation

## 2012-04-28 DIAGNOSIS — A499 Bacterial infection, unspecified: Secondary | ICD-10-CM | POA: Insufficient documentation

## 2012-04-28 DIAGNOSIS — R300 Dysuria: Secondary | ICD-10-CM | POA: Insufficient documentation

## 2012-04-28 DIAGNOSIS — B9689 Other specified bacterial agents as the cause of diseases classified elsewhere: Secondary | ICD-10-CM | POA: Insufficient documentation

## 2012-04-28 DIAGNOSIS — R5381 Other malaise: Secondary | ICD-10-CM | POA: Insufficient documentation

## 2012-04-28 HISTORY — DX: Essential (primary) hypertension: I10

## 2012-04-28 HISTORY — DX: Immune thrombocytopenic purpura: D69.3

## 2012-04-28 LAB — URINALYSIS, ROUTINE W REFLEX MICROSCOPIC
Glucose, UA: NEGATIVE mg/dL
Hgb urine dipstick: NEGATIVE
Ketones, ur: NEGATIVE mg/dL
Protein, ur: NEGATIVE mg/dL
Urobilinogen, UA: 0.2 mg/dL (ref 0.0–1.0)

## 2012-04-28 LAB — DIFFERENTIAL
Basophils Absolute: 0 10*3/uL (ref 0.0–0.1)
Lymphocytes Relative: 14 % (ref 12–46)
Lymphs Abs: 1.7 10*3/uL (ref 0.7–4.0)
Neutro Abs: 9 10*3/uL — ABNORMAL HIGH (ref 1.7–7.7)
Neutrophils Relative %: 78 % — ABNORMAL HIGH (ref 43–77)

## 2012-04-28 LAB — CBC
Platelets: 207 10*3/uL (ref 150–400)
RBC: 4.75 MIL/uL (ref 3.87–5.11)
RDW: 12.2 % (ref 11.5–15.5)
WBC: 11.6 10*3/uL — ABNORMAL HIGH (ref 4.0–10.5)

## 2012-04-28 LAB — WET PREP, GENITAL

## 2012-04-28 LAB — BASIC METABOLIC PANEL
CO2: 24 mEq/L (ref 19–32)
Calcium: 9.2 mg/dL (ref 8.4–10.5)
Chloride: 103 mEq/L (ref 96–112)
Glucose, Bld: 102 mg/dL — ABNORMAL HIGH (ref 70–99)
Potassium: 3.7 mEq/L (ref 3.5–5.1)
Sodium: 136 mEq/L (ref 135–145)

## 2012-04-28 LAB — PREGNANCY, URINE: Preg Test, Ur: NEGATIVE

## 2012-04-28 MED ORDER — METRONIDAZOLE 500 MG PO TABS: 500.0000 mg | ORAL_TABLET | Freq: Two times a day (BID) | ORAL | Status: AC

## 2012-04-28 NOTE — ED Provider Notes (Signed)
History     CSN: [id number]  Arrival date & time 04/28/12  2536   First MD Initiated Contact with Patient 04/28/12 0054      Chief Complaint  Patient presents with  . Urinary Tract Infection  . Back Pain    (Consider location/radiation/quality/duration/timing/severity/associated sxs/prior treatment) HPI  Emma Smith is a 36 y.o. female who presents to the Emergency Department complaining of pain with urination and pain with wiping that began several days ago. She has taken 3 days of Septra and 3 days of Cipro with no change. She spoke with her OB/GYN and was given diflucan which she has taken x 2 doses. Continues to have fatigue, pain with wiping, low back pain. Denies fevers, chills, nausea, vomiting, diarrhea.    Past Medical History  Diagnosis Date  . ITP (idiopathic thrombocytopenic purpura)   . Hypertension     Past Surgical History  Procedure Date  . Cesarean section     History reviewed. No pertinent family history.  History  Substance Use Topics  . Smoking status: Never Smoker   . Smokeless tobacco: Not on file  . Alcohol Use: No    OB History    Grav Para Term Preterm Abortions TAB SAB Ect Mult Living                  Review of Systems  Constitutional: Positive for fatigue. Negative for fever.       10 Systems reviewed and are negative for acute change except as noted in the HPI.  HENT: Negative for congestion.   Eyes: Negative for discharge and redness.  Respiratory: Negative for cough and shortness of breath.   Cardiovascular: Negative for chest pain.  Gastrointestinal: Negative for vomiting and abdominal pain.  Genitourinary: Positive for dysuria and pelvic pain.  Musculoskeletal: Positive for back pain.  Skin: Negative for rash.  Neurological: Negative for syncope, numbness and headaches.  Psychiatric/Behavioral:       No behavior change.    Allergies  Review of patient's allergies indicates no known allergies.  Home Medications    Current Outpatient Rx  Name Route Sig Dispense Refill  . ACEBUTOLOL HCL 200 MG PO CAPS Oral Take 200 mg by mouth daily.    Marland Kitchen ALPRAZOLAM 0.5 MG PO TABS Oral Take 0.5 mg by mouth at bedtime as needed.    Marland Kitchen FAMOTIDINE 40 MG PO TABS Oral Take 40 mg by mouth daily as needed.    Marland Kitchen PREDNISONE (PAK) 10 MG PO TABS Oral Take 10 mg by mouth daily. Tapered for a month when ITP flares per pt.      BP 143/98  Pulse 110  Temp(Src) 98.2 F (36.8 C) (Oral)  Resp 20  Ht 5\' 6"  (1.676 m)  Wt 125 lb (56.7 kg)  BMI 20.18 kg/m2  SpO2 98%  LMP 04/14/2012  Physical Exam  Nursing note and vitals reviewed. Constitutional:       Awake, alert, nontoxic appearance.  HENT:  Head: Atraumatic.  Eyes: Right eye exhibits no discharge. Left eye exhibits no discharge.  Neck: Neck supple.  Cardiovascular: Normal heart sounds and intact distal pulses.        tachycardia  Pulmonary/Chest: Effort normal. She exhibits no tenderness.  Abdominal: Soft. There is no tenderness. There is no rebound.  Genitourinary: Vagina normal and uterus normal.       No cva tenderness. Scant amount of vaginal discharge. Cervix closed no friability. No vaginal or labial lesions. Bimanual exam without adnexal tenderness, cervical  motion tenderness.  Musculoskeletal: She exhibits no tenderness.       Baseline ROM, no obvious new focal weakness.  Neurological:       Mental status and motor strength appears baseline for patient and situation.  Skin: No rash noted.  Psychiatric: She has a normal mood and affect.    ED Course  Procedures (including critical care time)  Results for orders placed during the hospital encounter of 04/28/12  URINALYSIS, ROUTINE W REFLEX MICROSCOPIC      Component Value Range   Color, Urine YELLOW  YELLOW    APPearance CLEAR  CLEAR    Specific Gravity, Urine <1.005 (*) 1.005 - 1.030    pH 6.0  5.0 - 8.0    Glucose, UA NEGATIVE  NEGATIVE (mg/dL)   Hgb urine dipstick NEGATIVE  NEGATIVE    Bilirubin  Urine NEGATIVE  NEGATIVE    Ketones, ur NEGATIVE  NEGATIVE (mg/dL)   Protein, ur NEGATIVE  NEGATIVE (mg/dL)   Urobilinogen, UA 0.2  0.0 - 1.0 (mg/dL)   Nitrite NEGATIVE  NEGATIVE    Leukocytes, UA NEGATIVE  NEGATIVE   PREGNANCY, URINE      Component Value Range   Preg Test, Ur NEGATIVE  NEGATIVE   WET PREP, GENITAL      Component Value Range   Yeast Wet Prep HPF POC NONE SEEN  NONE SEEN    Trich, Wet Prep NONE SEEN  NONE SEEN    Clue Cells Wet Prep HPF POC FEW (*) NONE SEEN    WBC, Wet Prep HPF POC MANY (*) NONE SEEN   CBC      Component Value Range   WBC 11.6 (*) 4.0 - 10.5 (K/uL)   RBC 4.75  3.87 - 5.11 (MIL/uL)   Hemoglobin 14.2  12.0 - 15.0 (g/dL)   HCT 16.1  09.6 - 04.5 (%)   MCV 85.3  78.0 - 100.0 (fL)   MCH 29.9  26.0 - 34.0 (pg)   MCHC 35.1  30.0 - 36.0 (g/dL)   RDW 40.9  81.1 - 91.4 (%)   Platelets 207  150 - 400 (K/uL)  DIFFERENTIAL      Component Value Range   Neutrophils Relative 78 (*) 43 - 77 (%)   Neutro Abs 9.0 (*) 1.7 - 7.7 (K/uL)   Lymphocytes Relative 14  12 - 46 (%)   Lymphs Abs 1.7  0.7 - 4.0 (K/uL)   Monocytes Relative 8  3 - 12 (%)   Monocytes Absolute 0.9  0.1 - 1.0 (K/uL)   Eosinophils Relative 1  0 - 5 (%)   Eosinophils Absolute 0.1  0.0 - 0.7 (K/uL)   Basophils Relative 0  0 - 1 (%)   Basophils Absolute 0.0  0.0 - 0.1 (K/uL)  BASIC METABOLIC PANEL      Component Value Range   Sodium 136  135 - 145 (mEq/L)   Potassium 3.7  3.5 - 5.1 (mEq/L)   Chloride 103  96 - 112 (mEq/L)   CO2 24  19 - 32 (mEq/L)   Glucose, Bld 102 (*) 70 - 99 (mg/dL)   BUN 14  6 - 23 (mg/dL)   Creatinine, Ser 7.82  0.50 - 1.10 (mg/dL)   Calcium 9.2  8.4 - 95.6 (mg/dL)   GFR calc non Af Amer >90  >90 (mL/min)   GFR calc Af Amer >90  >90 (mL/min)     MDM  Patient with burning and irritation when urinating and when wiping. No acute findings  on physical exam. Labs are unremarkable. Wet prep shows a few clue cells. Patient is ready to yourself with Septra and Cipro for  possible UTI. We'll write a prescription for metronidazole that she can take if she feels she needs it. She is to followup with her OB/GYN. Dx testing d/w pt . Questions answered.  Verb understanding, agreeable to d/c home with outpt f/u.Pt stable in ED with no significant deterioration in condition.The patient appears reasonably screened and/or stabilized for discharge and I doubt any other medical condition or other [facility] requiring further screening, evaluation, or treatment in the ED at this time prior to discharge.  MDM Reviewed: nursing note and vitals Interpretation: labs            Nicoletta Dress. Colon Branch, MD 04/28/12 [phone]

## 2012-04-28 NOTE — ED Notes (Signed)
Started out with burning and irritation thought it was an UTI, Took meds at home that did not help per pt. GYN called in Diflucan and it did not help, now starting to have back pain per pt. Pulse rate is up per pt.

## 2012-04-28 NOTE — Discharge Instructions (Signed)
Your blood work was normal here tonight. You do have bacterial vaginosis. Treatment is metronidazole for 7 days. Follow up with your OB/GYN.   Bacterial Vaginosis Bacterial vaginosis (BV) is a vaginal infection where the normal balance of bacteria in the vagina is disrupted. The normal balance is then replaced by an overgrowth of certain bacteria. There are several different kinds of bacteria that can cause BV. BV is the most common vaginal infection in women of childbearing age. CAUSES   The cause of BV is not fully understood. BV develops when there is an increase or imbalance of harmful bacteria.   Some activities or behaviors can upset the normal balance of bacteria in the vagina and put women at increased risk including:   Having a new sex partner or multiple sex partners.   Douching.   Using an intrauterine device (IUD) for contraception.   It is not clear what role sexual activity plays in the development of BV. However, women that have never had sexual intercourse are rarely infected with BV.  Women do not get BV from toilet seats, bedding, swimming pools or from touching objects around them.  SYMPTOMS   Grey vaginal discharge.   A fish-like odor with discharge, especially after sexual intercourse.   Itching or burning of the vagina and vulva.   Burning or pain with urination.   Some women have no signs or symptoms at all.  DIAGNOSIS  Your caregiver must examine the vagina for signs of BV. Your caregiver will perform lab tests and look at the sample of vaginal fluid through a microscope. They will look for bacteria and abnormal cells (clue cells), a pH test higher than 4.5, and a positive amine test all associated with BV.  RISKS AND COMPLICATIONS   Pelvic inflammatory disease (PID).   Infections following gynecology surgery.   Developing HIV.   Developing herpes virus.  TREATMENT  Sometimes BV will clear up without treatment. However, all women with symptoms of BV  should be treated to avoid complications, especially if gynecology surgery is planned. Female partners generally do not need to be treated. However, BV may spread between female sex partners so treatment is helpful in preventing a recurrence of BV.   BV may be treated with antibiotics. The antibiotics come in either pill or vaginal cream forms. Either can be used with nonpregnant or pregnant women, but the recommended dosages differ. These antibiotics are not harmful to the baby.   BV can recur after treatment. If this happens, a second round of antibiotics will often be prescribed.   Treatment is important for pregnant women. If not treated, BV can cause a premature delivery, especially for a pregnant woman who had a premature birth in the past. All pregnant women who have symptoms of BV should be checked and treated.   For chronic reoccurrence of BV, treatment with a type of prescribed gel vaginally twice a week is helpful.  HOME CARE INSTRUCTIONS   Finish all medication as directed by your caregiver.   Do not have sex until treatment is completed.   Tell your sexual partner that you have a vaginal infection. They should see their caregiver and be treated if they have problems, such as a mild rash or itching.   Practice safe sex. Use condoms. Only have 1 sex partner.  PREVENTION  Basic prevention steps can help reduce the risk of upsetting the natural balance of bacteria in the vagina and developing BV:  Do not have sexual intercourse (be abstinent).  Do not douche.   Use all of the medicine prescribed for treatment of BV, even if the signs and symptoms go away.   Tell your sex partner if you have BV. That way, they can be treated, if needed, to prevent reoccurrence.  SEEK MEDICAL CARE IF:   Your symptoms are not improving after 3 days of treatment.   You have increased discharge, pain, or fever.  MAKE SURE YOU:   Understand these instructions.   Will watch your condition.    Will get help right away if you are not doing well or get worse.  FOR MORE INFORMATION  Division of STD Prevention (DSTDP), Centers for Disease Control and Prevention: [employer] American Social Health Association (ASHA): www.ashastd.org  Document Released: 11/26/2005 Document Revised: 11/15/2011 Document Reviewed: 05/19/2009 [facility] Patient Information 2012 [place], [place].

## 2012-04-29 LAB — GC/CHLAMYDIA PROBE AMP, GENITAL: Chlamydia, DNA Probe: NEGATIVE

## 2012-06-09 ENCOUNTER — Encounter (HOSPITAL_COMMUNITY): Payer: BC Managed Care – PPO | Attending: Oncology

## 2012-06-09 DIAGNOSIS — D869 Sarcoidosis, unspecified: Secondary | ICD-10-CM | POA: Insufficient documentation

## 2012-06-09 DIAGNOSIS — D693 Immune thrombocytopenic purpura: Secondary | ICD-10-CM

## 2012-06-09 LAB — CBC
HCT: 39.9 % (ref 36.0–46.0)
Hemoglobin: 13.5 g/dL (ref 12.0–15.0)
MCV: 87.5 fL (ref 78.0–100.0)
Platelets: 184 10*3/uL (ref 150–400)
RBC: 4.56 MIL/uL (ref 3.87–5.11)
WBC: 4.7 10*3/uL (ref 4.0–10.5)

## 2012-06-09 NOTE — Progress Notes (Signed)
Lab draw

## 2012-06-10 ENCOUNTER — Encounter (HOSPITAL_BASED_OUTPATIENT_CLINIC_OR_DEPARTMENT_OTHER): Payer: BC Managed Care – PPO | Admitting: Oncology

## 2012-06-10 ENCOUNTER — Encounter (HOSPITAL_COMMUNITY): Payer: Self-pay | Admitting: Oncology

## 2012-06-10 VITALS — BP 112/76 | HR 90 | Temp 98.5°F | Ht 65.0 in | Wt 123.2 lb

## 2012-06-10 DIAGNOSIS — D693 Immune thrombocytopenic purpura: Secondary | ICD-10-CM

## 2012-06-10 NOTE — Progress Notes (Signed)
The patient has ITP that is in remission. She had the HELLP syndrome at the end of her first pregnancy as well. She also sarcoidosis documented by Dr. Dewayne Shorter 2004 by mediastinoscopy and bronchoscopy after an abnormal chest x-ray.  She is doing very well her platelets hemoglobin and white count are excellent. She's had no petechiae or bleeding from the GU or GI tracts since I've seen her last year. We will continue to see her once a year with blood work sooner if she sees petechiae or any bleeding.

## 2012-06-10 NOTE — Patient Instructions (Addendum)
Mattalyn Anderegg Gee  [id number] [birth year] Dr. Glenford Peers  [facility]  Discharge Instructions  RECOMMENDATIONS MADE BY THE CONSULTANT AND ANY TEST RESULTS WILL BE SENT TO YOUR REFERRING DOCTOR.   EXAM FINDINGS BY MD TODAY AND SIGNS AND SYMPTOMS TO REPORT TO CLINIC OR PRIMARY MD: You are doing well.  Report any unusual bruising or bleeding.  MEDICATIONS PRESCRIBED: None      SPECIAL INSTRUCTIONS/FOLLOW-UP: Lab work Needed in 1 year and Return to Clinic after labs in 1 year.   I acknowledge that I have been informed and understand all the instructions given to me and received a copy. I do not have any more questions at this time, but understand that I may call the Specialty Clinic at [facility] at [phone] during business hours should I have any further questions or need assistance in obtaining follow-up care.    __________________________________________  _____________  __________ Signature of Patient or Authorized Representative            Date                   Time    __________________________________________ Nurse's Signature

## 2012-08-04 ENCOUNTER — Telehealth (HOSPITAL_COMMUNITY): Payer: Self-pay | Admitting: Oncology

## 2012-08-04 ENCOUNTER — Encounter (HOSPITAL_COMMUNITY): Payer: BC Managed Care – PPO | Attending: Oncology

## 2012-08-04 DIAGNOSIS — D693 Immune thrombocytopenic purpura: Secondary | ICD-10-CM | POA: Insufficient documentation

## 2012-08-04 LAB — CBC WITH DIFFERENTIAL/PLATELET
Eosinophils Absolute: 0 10*3/uL (ref 0.0–0.7)
HCT: 43 % (ref 36.0–46.0)
Hemoglobin: 15 g/dL (ref 12.0–15.0)
Lymphs Abs: 0.5 10*3/uL — ABNORMAL LOW (ref 0.7–4.0)
MCH: 29.8 pg (ref 26.0–34.0)
MCHC: 34.9 g/dL (ref 30.0–36.0)
MCV: 85.3 fL (ref 78.0–100.0)
Monocytes Absolute: 0 10*3/uL — ABNORMAL LOW (ref 0.1–1.0)
Monocytes Relative: 1 % — ABNORMAL LOW (ref 3–12)
Neutrophils Relative %: 91 % — ABNORMAL HIGH (ref 43–77)
RBC: 5.04 MIL/uL (ref 3.87–5.11)

## 2012-08-04 NOTE — Progress Notes (Signed)
Emma Smith's reason for visit today are for labs as scheduled per MD orders.  Venipuncture performed with a 23 gauge butterfly needle to R Antecubital.  Emma Smith tolerated venipuncture well and without incident; questions were answered and patient was discharged.

## 2012-09-30 ENCOUNTER — Other Ambulatory Visit (HOSPITAL_COMMUNITY): Payer: Self-pay | Admitting: Family Medicine

## 2012-09-30 ENCOUNTER — Ambulatory Visit (HOSPITAL_COMMUNITY)
Admission: RE | Admit: 2012-09-30 | Discharge: 2012-09-30 | Disposition: A | Discharge status: Home / Self Care | Payer: BC Managed Care – PPO | Source: Ambulatory Visit | Attending: Family Medicine | Admitting: Family Medicine

## 2012-09-30 DIAGNOSIS — Z01419 Encounter for gynecological examination (general) (routine) without abnormal findings: Secondary | ICD-10-CM | POA: Insufficient documentation

## 2012-09-30 DIAGNOSIS — D869 Sarcoidosis, unspecified: Secondary | ICD-10-CM

## 2013-04-13 ENCOUNTER — Other Ambulatory Visit (HOSPITAL_COMMUNITY): Payer: Self-pay | Admitting: Oncology

## 2013-04-13 ENCOUNTER — Encounter (HOSPITAL_COMMUNITY): Payer: BC Managed Care – PPO | Attending: Oncology

## 2013-04-13 DIAGNOSIS — D693 Immune thrombocytopenic purpura: Secondary | ICD-10-CM

## 2013-04-13 LAB — CBC WITH DIFFERENTIAL/PLATELET
Basophils Relative: 0 % (ref 0–1)
Eosinophils Absolute: 0 10*3/uL (ref 0.0–0.7)
Eosinophils Relative: 0 % (ref 0–5)
Lymphs Abs: 0.8 10*3/uL (ref 0.7–4.0)
MCH: 30.4 pg (ref 26.0–34.0)
MCHC: 35 g/dL (ref 30.0–36.0)
MCV: 86.8 fL (ref 78.0–100.0)
Monocytes Relative: 3 % (ref 3–12)
Platelets: 218 10*3/uL (ref 150–400)
RBC: 4.84 MIL/uL (ref 3.87–5.11)

## 2013-04-13 LAB — COMPREHENSIVE METABOLIC PANEL
BUN: 14 mg/dL (ref 6–23)
Calcium: 9.1 mg/dL (ref 8.4–10.5)
GFR calc Af Amer: >90 mL/min (ref >90)
Glucose, Bld: 125 mg/dL — ABNORMAL HIGH (ref 70–99)
Sodium: 138 mEq/L (ref 135–145)
Total Protein: 6.7 g/dL (ref 6.0–8.3)

## 2013-04-13 NOTE — Progress Notes (Signed)
Labs drawn today for cbc/diff,cmp 

## 2013-06-08 ENCOUNTER — Encounter (HOSPITAL_COMMUNITY): Payer: BC Managed Care – PPO | Attending: Oncology

## 2013-06-08 DIAGNOSIS — D693 Immune thrombocytopenic purpura: Secondary | ICD-10-CM | POA: Insufficient documentation

## 2013-06-08 LAB — CBC
HCT: 38.4 % (ref 36.0–46.0)
Hemoglobin: 13.4 g/dL (ref 12.0–15.0)
MCH: 30.2 pg (ref 26.0–34.0)
MCHC: 34.9 g/dL (ref 30.0–36.0)
MCV: 86.5 fL (ref 78.0–100.0)

## 2013-06-08 NOTE — Progress Notes (Signed)
Labs drawn today for cbc 

## 2013-06-10 ENCOUNTER — Ambulatory Visit (HOSPITAL_COMMUNITY): Payer: BC Managed Care – PPO | Admitting: Oncology

## 2013-06-17 ENCOUNTER — Ambulatory Visit (HOSPITAL_COMMUNITY): Payer: BC Managed Care – PPO | Admitting: Oncology

## 2013-06-26 ENCOUNTER — Encounter (HOSPITAL_COMMUNITY): Payer: BC Managed Care – PPO | Attending: Oncology | Admitting: Oncology

## 2013-06-26 ENCOUNTER — Encounter (HOSPITAL_COMMUNITY): Payer: Self-pay | Admitting: Oncology

## 2013-06-26 VITALS — BP 123/85 | HR 78 | Temp 98.1°F | Resp 14 | Wt 125.0 lb

## 2013-06-26 DIAGNOSIS — O1410 Severe pre-eclampsia, unspecified trimester: Secondary | ICD-10-CM

## 2013-06-26 DIAGNOSIS — O1423 HELLP syndrome (HELLP), third trimester: Secondary | ICD-10-CM | POA: Insufficient documentation

## 2013-06-26 DIAGNOSIS — D693 Immune thrombocytopenic purpura: Secondary | ICD-10-CM

## 2013-06-26 DIAGNOSIS — O1420 HELLP syndrome (HELLP), unspecified trimester: Secondary | ICD-10-CM

## 2013-06-26 DIAGNOSIS — D869 Sarcoidosis, unspecified: Secondary | ICD-10-CM | POA: Insufficient documentation

## 2013-06-26 HISTORY — DX: Sarcoidosis, unspecified: D86.9

## 2013-06-26 HISTORY — DX: Immune thrombocytopenic purpura: D69.3

## 2013-06-26 HISTORY — DX: HELLP syndrome (HELLP), unspecified trimester: O14.20

## 2013-06-26 NOTE — Patient Instructions (Addendum)
[facility] Discharge Instructions  RECOMMENDATIONS MADE BY THE CONSULTANT AND ANY TEST RESULTS WILL BE SENT TO YOUR REFERRING PHYSICIAN.  EXAM FINDINGS BY THE PHYSICIAN TODAY AND SIGNS OR SYMPTOMS TO REPORT TO CLINIC OR PRIMARY PHYSICIAN: Petechiae rash  MEDICATIONS PRESCRIBED:  None  INSTRUCTIONS GIVEN AND DISCUSSED: None  SPECIAL INSTRUCTIONS/FOLLOW-UP: Call clinic with any signs of bleeding, petechiae rash, easy bruising.  If actively bleeding and unable to stop, report to ED immediately.   Thank you for choosing [facility] to provide your oncology and hematology care.  To afford each patient quality time with our providers, please arrive at least 15 minutes before your scheduled appointment time.  With your help, our goal is to use those 15 minutes to complete the necessary work-up to ensure our physicians have the information they need to help with your evaluation and healthcare recommendations.    Effective January 1st, 2014, we ask that you re-schedule your appointment with our physicians should you arrive 10 or more minutes late for your appointment.  We strive to give you quality time with our providers, and arriving late affects you and other patients whose appointments are after yours.    Again, thank you for choosing [facility].  Our hope is that these requests will decrease the amount of time that you wait before being seen by our physicians.       _____________________________________________________________  Should you have questions after your visit to [facility], please contact our office at [phone] between the hours of 8:30 a.m. and 5:00 p.m.  Voicemails left after 4:30 p.m. will not be returned until the following business day.  For prescription refill requests, have your pharmacy contact our office with your prescription refill request.

## 2013-06-26 NOTE — Progress Notes (Signed)
Emma Ribas, MD [address] [place] [address]  ITP (idiopathic thrombocytopenic purpura)  HELLP syndrome, third trimester  Sarcoidosis  CURRENT THERAPY: Observation  INTERVAL HISTORY: Emma Smith 37 y.o. female returns for  regular  visit for followup of ITP, chronic, stable  Reviewed patient's laboratory work with her. Her white blood cell count 6.3, hemoglobin 13.4, and platelet count 203,000.   I personally reviewed and went over radiographic studies with the patient.  Chest x-ray performed on 09/28/2012 by her primary care physician with a negative chest x-ray.  Her bone density was negative on 08/29/2011 for osteoporosis or osteopenia.  She denies any complaints.  Patient was provided patient education regarding ITP although she is very well versed in this condition. She only had one instance requiring treatment in the past and she was treated with high-dose prednisone 60 mg daily x30 days with a taper following. The patient was educated she ever has a relapse in the future at this point time, dexamethasone and shown to be low the more effective than prednisone. She probably treated with 20 mg of dexamethasone twice daily x4 days and then repeated 4 weeks later if necessary. She's never had a splenectomy. Her only treatment for ITP was a high-dose prednisone one time.  She admits to easy bruisability in the past most recently but her poorly count has been very good and within normal limits.  Hematologically, the patient denies any complaints of ROS questioning is negative. She denies any bleeding or petechial rashes recently. She denies any petechiae of the palate. She denies any hemoptysis, blood in her stool, black tarry stool, and hematuria.  Past Medical History  Diagnosis Date  . ITP (idiopathic thrombocytopenic purpura)   . Hypertension   . 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 HELLP syndrome  06/26/2013    At end of first pregnancy  . Sarcoidosis 06/26/2013    Documented by Dr. Edwyna Shell in 2004 by mediastinoscopy and bronchoscopy after an abnormal chest x-ray    has ITP (idiopathic thrombocytopenic purpura); HELLP syndrome; and Sarcoidosis on her problem list.     has no allergies on file.  Emma Smith does not currently have medications on file.  Past Surgical History  Procedure Laterality Date  . Cesarean section    . Bone marrow biopsy      Denies any headaches, dizziness, double vision, fevers, chills, night sweats, nausea, vomiting, diarrhea, constipation, chest pain, heart palpitations, shortness of breath, blood in stool, black tarry stool, urinary pain, urinary burning, urinary frequency, hematuria.   PHYSICAL EXAMINATION  ECOG PERFORMANCE STATUS: 0 - Asymptomatic  Filed Vitals:   06/26/13 1000  BP: 123/85  Pulse: 78  Temp: 98.1 F (36.7 C)  Resp: 14    GENERAL:alert, healthy, no distress, well nourished, well developed, comfortable, cooperative and smiling SKIN: skin color, texture, turgor are normal, no rashes or significant lesions HEAD: Normocephalic, No masses, lesions, tenderness or abnormalities EYES: normal, PERRLA, EOMI, Conjunctiva are pink and non-injected EARS: External ears normal OROPHARYNX:mucous membranes are moist, no petechiae NECK: supple, no adenopathy, thyroid normal size, non-tender, without nodularity, no stridor, non-tender, trachea midline LYMPH:  no palpable lymphadenopathy, no hepatosplenomegaly BREAST:not examined LUNGS: clear to auscultation and percussion HEART: regular rate & rhythm, no murmurs, no gallops, S1 normal and S2 normal ABDOMEN:abdomen soft, non-tender, normal bowel sounds, no masses or organomegaly and no hepatosplenomegaly BACK: Back symmetric, no curvature., No CVA tenderness  EXTREMITIES:less then 2 second capillary refill, no joint deformities, effusion, or inflammation, no edema, no skin discoloration, no  clubbing, no cyanosis  NEURO: alert & oriented x 3 with fluent speech, no focal motor/sensory deficits, gait normal   LABORATORY DATA: CBC    Component Value Date/Time   WBC 6.3 06/08/2013 0929   RBC 4.44 06/08/2013 0929   HGB 13.4 06/08/2013 0929   HCT 38.4 06/08/2013 0929   PLT 203 06/08/2013 0929   MCV 86.5 06/08/2013 0929   MCH 30.2 06/08/2013 0929   MCHC 34.9 06/08/2013 0929   RDW 12.4 06/08/2013 0929   LYMPHSABS 0.8 04/13/2013 0855   MONOABS 0.3 04/13/2013 0855   EOSABS 0.0 04/13/2013 0855   BASOSABS 0.0 04/13/2013 0855      RADIOGRAPHIC STUDIES:  09/30/2012  *RADIOLOGY REPORT*  Clinical Data: Sarcoidosis.  CHEST - 2 VIEW  Comparison: The PA and lateral chest 08/24/2011 and 07/19/2010.  Findings: Prominent nipple shadows are again noted. Lungs are  clear. Cardiac and mediastinal contours appear normal. No  pneumothorax or pleural fluid is identified. No focal bony  abnormality.  IMPRESSION:  Negative chest.  Original Report Authenticated By: Bernadene Bell. D'ALESSIO, M.D.    ASSESSMENT:  1. ITP, in remission 2. HELLP Syndrome at end of first pregnancy 3. Sarcoidosis diagnosed by Dr. Dewayne Shorter in 2004 via bronchoscopy and mediastinoscopy.    PLAN:  1. I personally reviewed and went over laboratory results with the patient. 2. I personally reviewed and went over radiographic studies with the patient. 3. Labs in 6 months and 12 months: CBC diff, BMET 4. Patient education regarding idiopathic thrombocytopenic purpura 5. Return in 1 year for follow-up.    THERAPY PLAN:  Patient remains in remission.  Labs in 6 + 12 months and we will see her yearly.  She knows to call the clinic with any issues.   All questions were answered. The patient knows to call the clinic with any problems, questions or concerns. We can certainly see the patient much sooner if necessary.  Patient and plan discussed with Dr. Erline Hau and he is in agreement with the aforementioned.    Emma Smith

## 2013-07-10 ENCOUNTER — Other Ambulatory Visit: Payer: Self-pay | Admitting: Dermatology

## 2013-10-02 ENCOUNTER — Other Ambulatory Visit (HOSPITAL_COMMUNITY): Payer: Self-pay | Admitting: Family Medicine

## 2013-10-02 ENCOUNTER — Ambulatory Visit (HOSPITAL_COMMUNITY)
Admission: RE | Admit: 2013-10-02 | Discharge: 2013-10-02 | Disposition: A | Discharge status: Home / Self Care | Payer: BC Managed Care – PPO | Source: Ambulatory Visit | Attending: Family Medicine | Admitting: Family Medicine

## 2013-10-02 DIAGNOSIS — I10 Essential (primary) hypertension: Secondary | ICD-10-CM

## 2013-10-02 DIAGNOSIS — Z0000 Encounter for general adult medical examination without abnormal findings: Secondary | ICD-10-CM

## 2013-12-25 ENCOUNTER — Encounter (HOSPITAL_COMMUNITY): Payer: BC Managed Care – PPO | Attending: Hematology and Oncology

## 2013-12-25 DIAGNOSIS — D693 Immune thrombocytopenic purpura: Secondary | ICD-10-CM | POA: Insufficient documentation

## 2013-12-25 LAB — CBC WITH DIFFERENTIAL/PLATELET
BASOS ABS: 0 10*3/uL (ref 0.0–0.1)
Basophils Relative: 0 % (ref 0–1)
Eosinophils Absolute: 0.1 10*3/uL (ref 0.0–0.7)
Eosinophils Relative: 2 % (ref 0–5)
HEMATOCRIT: 40.9 % (ref 36.0–46.0)
HEMOGLOBIN: 14.4 g/dL (ref 12.0–15.0)
LYMPHS PCT: 27 % (ref 12–46)
Lymphs Abs: 1.7 10*3/uL (ref 0.7–4.0)
MCH: 30.8 pg (ref 26.0–34.0)
MCHC: 35.2 g/dL (ref 30.0–36.0)
MCV: 87.6 fL (ref 78.0–100.0)
MONO ABS: 0.7 10*3/uL (ref 0.1–1.0)
MONOS PCT: 12 % (ref 3–12)
NEUTROS ABS: 3.6 10*3/uL (ref 1.7–7.7)
NEUTROS PCT: 59 % (ref 43–77)
Platelets: 191 10*3/uL (ref 150–400)
RBC: 4.67 MIL/uL (ref 3.87–5.11)
RDW: 12.4 % (ref 11.5–15.5)
WBC: 6.1 10*3/uL (ref 4.0–10.5)

## 2013-12-25 LAB — BASIC METABOLIC PANEL
BUN: 10 mg/dL (ref 6–23)
CHLORIDE: 103 meq/L (ref 96–112)
CO2: 29 meq/L (ref 19–32)
CREATININE: 0.88 mg/dL (ref 0.50–1.10)
Calcium: 8.5 mg/dL (ref 8.4–10.5)
GFR calc non Af Amer: 82 mL/min — ABNORMAL LOW (ref >90)
Glucose, Bld: 83 mg/dL (ref 70–99)
POTASSIUM: 4 meq/L (ref 3.7–5.3)
Sodium: 141 mEq/L (ref 137–147)

## 2013-12-25 NOTE — Progress Notes (Signed)
Labs drawn today for cbc/diff,bmp 

## 2014-01-18 ENCOUNTER — Encounter (HOSPITAL_COMMUNITY): Payer: BC Managed Care – PPO | Attending: Hematology and Oncology

## 2014-01-18 ENCOUNTER — Other Ambulatory Visit (HOSPITAL_COMMUNITY): Payer: Self-pay | Admitting: Oncology

## 2014-01-18 DIAGNOSIS — D693 Immune thrombocytopenic purpura: Secondary | ICD-10-CM

## 2014-01-18 DIAGNOSIS — E876 Hypokalemia: Secondary | ICD-10-CM

## 2014-01-18 LAB — CBC WITH DIFFERENTIAL/PLATELET
Basophils Absolute: 0 10*3/uL (ref 0.0–0.1)
Basophils Relative: 0 % (ref 0–1)
Eosinophils Absolute: 0.1 10*3/uL (ref 0.0–0.7)
Eosinophils Relative: 0 % (ref 0–5)
HCT: 41.9 % (ref 36.0–46.0)
Hemoglobin: 14.4 g/dL (ref 12.0–15.0)
Lymphocytes Relative: 25 % (ref 12–46)
Lymphs Abs: 3.2 10*3/uL (ref 0.7–4.0)
MCH: 30.3 pg (ref 26.0–34.0)
MCHC: 34.4 g/dL (ref 30.0–36.0)
MCV: 88 fL (ref 78.0–100.0)
Monocytes Absolute: 1.5 10*3/uL — ABNORMAL HIGH (ref 0.1–1.0)
Monocytes Relative: 12 % (ref 3–12)
NEUTROS ABS: 8 10*3/uL — AB (ref 1.7–7.7)
NEUTROS PCT: 63 % (ref 43–77)
PLATELETS: 250 10*3/uL (ref 150–400)
RBC: 4.76 MIL/uL (ref 3.87–5.11)
RDW: 12.5 % (ref 11.5–15.5)
WBC: 12.8 10*3/uL — ABNORMAL HIGH (ref 4.0–10.5)

## 2014-01-18 LAB — BASIC METABOLIC PANEL
BUN: 16 mg/dL (ref 6–23)
CALCIUM: 9 mg/dL (ref 8.4–10.5)
CHLORIDE: 102 meq/L (ref 96–112)
CO2: 30 meq/L (ref 19–32)
Creatinine, Ser: 0.89 mg/dL (ref 0.50–1.10)
GFR calc Af Amer: >90 mL/min (ref >90)
GFR calc non Af Amer: 81 mL/min — ABNORMAL LOW (ref >90)
GLUCOSE: 77 mg/dL (ref 70–99)
Potassium: 3.5 mEq/L — ABNORMAL LOW (ref 3.7–5.3)
SODIUM: 141 meq/L (ref 137–147)

## 2014-01-18 MED ORDER — POTASSIUM CHLORIDE CRYS ER 20 MEQ PO TBCR: 20.0000 meq | EXTENDED_RELEASE_TABLET | Freq: Every day | ORAL | Status: DC

## 2014-01-18 NOTE — Progress Notes (Signed)
Labs drawn today for cbc/diff,bmp 

## 2014-01-29 ENCOUNTER — Telehealth (HOSPITAL_COMMUNITY): Payer: Self-pay

## 2014-01-29 ENCOUNTER — Other Ambulatory Visit (HOSPITAL_COMMUNITY): Payer: Self-pay | Admitting: Oncology

## 2014-01-29 NOTE — Telephone Encounter (Signed)
error 

## 2014-01-29 NOTE — Telephone Encounter (Signed)
Message copied by Mellissa Kohut on Fri Jan 29, 2014  1:26 PM ------      Message from: [facility]      Created: Fri Jan 29, 2014 12:44 PM       I got a refill request for Prednisone.  Can someone call and find out the reason for this.  Her labs recently show a normal platelet count and therefore from an ITP perspective, there is no need for Prednisone. ------

## 2014-01-29 NOTE — Telephone Encounter (Signed)
Patient stated "I like to keep some prednisone hand for those instances that I have to be out of town and my platelets drop.  It keeps me from having to go to the ED.  I'm getting ready to go to [place] and would like to have some on hand just in case."

## 2014-03-02 ENCOUNTER — Other Ambulatory Visit (HOSPITAL_COMMUNITY): Payer: Self-pay | Admitting: Oncology

## 2014-03-02 ENCOUNTER — Encounter (HOSPITAL_COMMUNITY): Payer: BC Managed Care – PPO | Attending: Hematology and Oncology

## 2014-03-02 DIAGNOSIS — M549 Dorsalgia, unspecified: Secondary | ICD-10-CM

## 2014-03-02 DIAGNOSIS — D693 Immune thrombocytopenic purpura: Secondary | ICD-10-CM

## 2014-03-02 LAB — COMPREHENSIVE METABOLIC PANEL
ALT: 11 U/L (ref 0–35)
AST: 14 U/L (ref 0–37)
Albumin: 3.9 g/dL (ref 3.5–5.2)
Alkaline Phosphatase: 99 U/L (ref 39–117)
BUN: 11 mg/dL (ref 6–23)
CALCIUM: 9 mg/dL (ref 8.4–10.5)
CO2: 31 meq/L (ref 19–32)
CREATININE: 0.78 mg/dL (ref 0.50–1.10)
Chloride: 105 mEq/L (ref 96–112)
Glucose, Bld: 73 mg/dL (ref 70–99)
Potassium: 3.9 mEq/L (ref 3.7–5.3)
Sodium: 145 mEq/L (ref 137–147)
TOTAL PROTEIN: 6.6 g/dL (ref 6.0–8.3)
Total Bilirubin: 0.5 mg/dL (ref 0.3–1.2)

## 2014-03-02 LAB — URINALYSIS, ROUTINE W REFLEX MICROSCOPIC
Bilirubin Urine: NEGATIVE
Glucose, UA: NEGATIVE mg/dL
Ketones, ur: NEGATIVE mg/dL
Leukocytes, UA: NEGATIVE
NITRITE: NEGATIVE
PH: 6 (ref 5.0–8.0)
Protein, ur: NEGATIVE mg/dL
Urobilinogen, UA: 0.2 mg/dL (ref 0.0–1.0)

## 2014-03-02 LAB — CBC WITH DIFFERENTIAL/PLATELET
BASOS ABS: 0 10*3/uL (ref 0.0–0.1)
Basophils Relative: 0 % (ref 0–1)
EOS PCT: 1 % (ref 0–5)
Eosinophils Absolute: 0.1 10*3/uL (ref 0.0–0.7)
HCT: 41 % (ref 36.0–46.0)
HEMOGLOBIN: 13.8 g/dL (ref 12.0–15.0)
LYMPHS PCT: 30 % (ref 12–46)
Lymphs Abs: 2.3 10*3/uL (ref 0.7–4.0)
MCH: 29.7 pg (ref 26.0–34.0)
MCHC: 33.7 g/dL (ref 30.0–36.0)
MCV: 88.2 fL (ref 78.0–100.0)
MONO ABS: 0.7 10*3/uL (ref 0.1–1.0)
Monocytes Relative: 9 % (ref 3–12)
Neutro Abs: 4.8 10*3/uL (ref 1.7–7.7)
Neutrophils Relative %: 61 % (ref 43–77)
PLATELETS: 215 10*3/uL (ref 150–400)
RBC: 4.65 MIL/uL (ref 3.87–5.11)
RDW: 12.8 % (ref 11.5–15.5)
WBC: 7.9 10*3/uL (ref 4.0–10.5)

## 2014-03-02 LAB — URINE MICROSCOPIC-ADD ON

## 2014-03-02 NOTE — Progress Notes (Signed)
Labs drawn today for cbc/diff,cmp,urine 

## 2014-06-18 ENCOUNTER — Other Ambulatory Visit (HOSPITAL_COMMUNITY): Payer: Self-pay

## 2014-06-18 DIAGNOSIS — D869 Sarcoidosis, unspecified: Secondary | ICD-10-CM

## 2014-06-18 DIAGNOSIS — D693 Immune thrombocytopenic purpura: Secondary | ICD-10-CM

## 2014-06-21 ENCOUNTER — Encounter (HOSPITAL_COMMUNITY): Payer: BC Managed Care – PPO | Attending: Hematology and Oncology

## 2014-06-21 ENCOUNTER — Other Ambulatory Visit (HOSPITAL_COMMUNITY): Payer: Self-pay | Admitting: Oncology

## 2014-06-21 DIAGNOSIS — E876 Hypokalemia: Secondary | ICD-10-CM

## 2014-06-21 DIAGNOSIS — D693 Immune thrombocytopenic purpura: Secondary | ICD-10-CM | POA: Insufficient documentation

## 2014-06-21 DIAGNOSIS — D869 Sarcoidosis, unspecified: Secondary | ICD-10-CM | POA: Insufficient documentation

## 2014-06-21 LAB — CBC WITH DIFFERENTIAL/PLATELET
BASOS ABS: 0 10*3/uL (ref 0.0–0.1)
BASOS PCT: 0 % (ref 0–1)
Eosinophils Absolute: 0.1 10*3/uL (ref 0.0–0.7)
Eosinophils Relative: 1 % (ref 0–5)
HEMATOCRIT: 39 % (ref 36.0–46.0)
Hemoglobin: 13.6 g/dL (ref 12.0–15.0)
Lymphocytes Relative: 25 % (ref 12–46)
Lymphs Abs: 1.7 10*3/uL (ref 0.7–4.0)
MCH: 30 pg (ref 26.0–34.0)
MCHC: 34.9 g/dL (ref 30.0–36.0)
MCV: 86.1 fL (ref 78.0–100.0)
Monocytes Absolute: 0.8 10*3/uL (ref 0.1–1.0)
Monocytes Relative: 12 % (ref 3–12)
Neutro Abs: 4.3 10*3/uL (ref 1.7–7.7)
Neutrophils Relative %: 62 % (ref 43–77)
Platelets: 178 10*3/uL (ref 150–400)
RBC: 4.53 MIL/uL (ref 3.87–5.11)
RDW: 12.3 % (ref 11.5–15.5)
WBC: 6.9 10*3/uL (ref 4.0–10.5)

## 2014-06-21 LAB — BASIC METABOLIC PANEL
ANION GAP: 10 (ref 5–15)
BUN: 12 mg/dL (ref 6–23)
CALCIUM: 8.9 mg/dL (ref 8.4–10.5)
CO2: 27 mEq/L (ref 19–32)
Chloride: 106 mEq/L (ref 96–112)
Creatinine, Ser: 0.82 mg/dL (ref 0.50–1.10)
GFR calc Af Amer: >90 mL/min (ref >90)
GFR calc non Af Amer: 89 mL/min — ABNORMAL LOW (ref >90)
Glucose, Bld: 82 mg/dL (ref 70–99)
Potassium: 3.4 mEq/L — ABNORMAL LOW (ref 3.7–5.3)
Sodium: 143 mEq/L (ref 137–147)

## 2014-06-21 MED ORDER — POTASSIUM CHLORIDE CRYS ER 20 MEQ PO TBCR: 20.0000 meq | EXTENDED_RELEASE_TABLET | Freq: Every day | ORAL | Status: DC

## 2014-06-21 NOTE — Progress Notes (Signed)
LABS DRAWN FOR CBCD,BMP

## 2014-06-24 ENCOUNTER — Ambulatory Visit (HOSPITAL_COMMUNITY): Payer: BC Managed Care – PPO | Admitting: Oncology

## 2014-06-25 ENCOUNTER — Other Ambulatory Visit (HOSPITAL_COMMUNITY): Payer: BC Managed Care – PPO

## 2014-06-26 NOTE — Progress Notes (Signed)
-  No show-  [name],[name]  

## 2014-07-01 ENCOUNTER — Ambulatory Visit (HOSPITAL_COMMUNITY): Payer: BC Managed Care – PPO | Admitting: Oncology

## 2014-10-07 ENCOUNTER — Ambulatory Visit (HOSPITAL_COMMUNITY)
Admission: RE | Admit: 2014-10-07 | Discharge: 2014-10-07 | Disposition: A | Discharge status: Home / Self Care | Payer: BC Managed Care – PPO | Source: Ambulatory Visit | Attending: Family Medicine | Admitting: Family Medicine

## 2014-10-07 ENCOUNTER — Other Ambulatory Visit: Payer: Self-pay | Admitting: Family Medicine

## 2014-10-07 ENCOUNTER — Other Ambulatory Visit (HOSPITAL_COMMUNITY): Payer: Self-pay | Admitting: Family Medicine

## 2014-10-07 DIAGNOSIS — Z0000 Encounter for general adult medical examination without abnormal findings: Secondary | ICD-10-CM

## 2014-10-07 DIAGNOSIS — D869 Sarcoidosis, unspecified: Secondary | ICD-10-CM

## 2014-10-07 DIAGNOSIS — Z1231 Encounter for screening mammogram for malignant neoplasm of breast: Secondary | ICD-10-CM

## 2014-10-29 ENCOUNTER — Ambulatory Visit
Admission: RE | Admit: 2014-10-29 | Discharge: 2014-10-29 | Disposition: A | Discharge status: Home / Self Care | Payer: BC Managed Care – PPO | Source: Ambulatory Visit | Attending: Family Medicine | Admitting: Family Medicine

## 2014-10-29 DIAGNOSIS — Z1231 Encounter for screening mammogram for malignant neoplasm of breast: Secondary | ICD-10-CM

## 2015-01-18 ENCOUNTER — Telehealth (HOSPITAL_COMMUNITY): Payer: Self-pay | Admitting: *Deleted

## 2015-01-18 NOTE — Telephone Encounter (Signed)
Yes, CBC diff, BMET

## 2015-02-07 ENCOUNTER — Ambulatory Visit (HOSPITAL_COMMUNITY): Payer: Self-pay | Admitting: Oncology

## 2015-02-08 NOTE — Assessment & Plan Note (Signed)
Documented by Dr. Arlyce Dice in 2004 by mediastinoscopy and bronchoscopy after an abnormal chest x-ray.  Biopsy proven.

## 2015-02-08 NOTE — Assessment & Plan Note (Addendum)
In remission.  Labs are stable.  Repeat labs today: CBC diff, BMET.  Labs in 6 months: CBC.  Labs in 12 months: CBC diff, CMET.  Return in 12 months for follow-up.

## 2015-02-08 NOTE — Progress Notes (Signed)
Emma Kilts, MD [place] [address]  ITP (idiopathic thrombocytopenic purpura) - Plan: CBC with Differential, CBC with Differential, Basic metabolic panel, CBC with Differential, Basic metabolic panel  Sarcoidosis - Plan: Basic metabolic panel  CURRENT THERAPY: Observation  INTERVAL HISTORY: Emma Smith 39 y.o. female returns for followup of ITP, chronic, stable.  I personally reviewed and went over laboratory results with the patient.  The results are noted within this dictation.  She has done very well over the past 12 months.  She reports that she is busy with her children and home life.  She is still working at a [employer].    She denies any easy bruising or bleeding.  She denies any petechial rash.  She reports that she started her menstrual cycle today.  She notes an associated abdominal cramping and flank cramping.  I suspect it is secondary to her menstrual cycle.  She looks well.  She does not look ill.    Hematologically, she denies any complaints and ROS questioning is negative.   Past Medical History  Diagnosis Date  . ITP (idiopathic thrombocytopenic purpura)   . Hypertension   . 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 HELLP syndrome 06/26/2013    At end of first pregnancy  . Sarcoidosis 06/26/2013    Documented by Dr. Arlyce Dice in 2004 by mediastinoscopy and bronchoscopy after an abnormal chest x-ray    has ITP (idiopathic thrombocytopenic purpura); HELLP syndrome in third trimester; and Sarcoidosis on her problem list.     has no allergies on file.  Emma Smith does not currently have medications on file.  Past Surgical History  Procedure Laterality Date  . Cesarean section    . Bone marrow biopsy      Denies any headaches, dizziness, double vision, fevers, chills, night sweats, nausea, vomiting, diarrhea, constipation, chest pain, heart palpitations, shortness of breath, blood in  stool, black tarry stool, urinary pain, urinary burning, urinary frequency, hematuria.   PHYSICAL EXAMINATION  ECOG PERFORMANCE STATUS: 0 - Asymptomatic  Filed Vitals:   02/09/15 1220  BP: 138/83  Pulse: 71  Temp: 97.8 F (36.6 C)  Resp: 16    GENERAL:alert, no distress, well nourished, well developed, comfortable, cooperative and smiling SKIN: skin color, texture, turgor are normal, no rashes or significant lesions HEAD: Normocephalic, No masses, lesions, tenderness or abnormalities EYES: normal, PERRLA, EOMI, Conjunctiva are pink and non-injected EARS: External ears normal OROPHARYNX:lips, buccal mucosa, and tongue normal and mucous membranes are moist  NECK: supple, no adenopathy, thyroid normal size, non-tender, without nodularity, no stridor, non-tender, trachea midline LYMPH:  no palpable lymphadenopathy BREAST:not examined LUNGS: clear to auscultation and percussion HEART: regular rate & rhythm, no murmurs, no gallops, S1 normal and S2 normal ABDOMEN:not examined BACK: Back symmetric, no curvature., No CVA tenderness EXTREMITIES:less then 2 second capillary refill, no joint deformities, effusion, or inflammation, no edema, no skin discoloration, no clubbing, no cyanosis  NEURO: alert & oriented x 3 with fluent speech, no focal motor/sensory deficits, gait normal   LABORATORY DATA: CBC    Component Value Date/Time   WBC 6.9 06/21/2014 1056   RBC 4.53 06/21/2014 1056   HGB 13.6 06/21/2014 1056   HCT 39.0 06/21/2014 1056   PLT 178 06/21/2014 1056   MCV 86.1 06/21/2014 1056   MCH 30.0 06/21/2014 1056   MCHC 34.9 06/21/2014 1056   RDW 12.3 06/21/2014 1056   LYMPHSABS 1.7  06/21/2014 1056   MONOABS 0.8 06/21/2014 1056   EOSABS 0.1 06/21/2014 1056   BASOSABS 0.0 06/21/2014 1056      Chemistry      Component Value Date/Time   NA 143 06/21/2014 1056   K 3.4* 06/21/2014 1056   CL 106 06/21/2014 1056   CO2 27 06/21/2014 1056   BUN 12 06/21/2014 1056   CREATININE  0.82 06/21/2014 1056   CREATININE 0.53 06/08/2008 0947      Component Value Date/Time   CALCIUM 8.9 06/21/2014 1056   ALKPHOS 99 03/02/2014 1051   AST 14 03/02/2014 1051   ALT 11 03/02/2014 1051   BILITOT 0.5 03/02/2014 1051       ASSESSMENT AND PLAN:  ITP (idiopathic thrombocytopenic purpura) In remission.  Labs are stable.  Repeat labs today: CBC diff, BMET.  Labs in 6 months: CBC.  Labs in 12 months: CBC diff, CMET.  Return in 12 months for follow-up.   Sarcoidosis Documented by Dr. Arlyce Dice in 2004 by mediastinoscopy and bronchoscopy after an abnormal chest x-ray.  Biopsy proven.   THERAPY PLAN:  Will monitor labs.  Patient is to report any abnormal bruising, bleeding, and rashes.    All questions were answered. The patient knows to call the clinic with any problems, questions or concerns. We can certainly see the patient much sooner if necessary.  Patient and plan discussed with Dr. Ancil Linsey and she is in agreement with the aforementioned.   This note is electronically signed by: Robynn Pane 02/09/2015 1:05 PM

## 2015-02-09 ENCOUNTER — Encounter (HOSPITAL_COMMUNITY): Payer: Self-pay | Admitting: Oncology

## 2015-02-09 ENCOUNTER — Encounter (HOSPITAL_BASED_OUTPATIENT_CLINIC_OR_DEPARTMENT_OTHER): Payer: Self-pay

## 2015-02-09 ENCOUNTER — Encounter (HOSPITAL_COMMUNITY): Payer: Self-pay | Attending: Oncology | Admitting: Oncology

## 2015-02-09 VITALS — BP 138/83 | HR 71 | Temp 97.8°F | Resp 16 | Wt 123.9 lb

## 2015-02-09 DIAGNOSIS — D693 Immune thrombocytopenic purpura: Secondary | ICD-10-CM

## 2015-02-09 DIAGNOSIS — D869 Sarcoidosis, unspecified: Secondary | ICD-10-CM | POA: Insufficient documentation

## 2015-02-09 LAB — CBC WITH DIFFERENTIAL/PLATELET
Basophils Absolute: 0 10*3/uL (ref 0.0–0.1)
Basophils Relative: 0 % (ref 0–1)
Eosinophils Absolute: 0.1 10*3/uL (ref 0.0–0.7)
Eosinophils Relative: 2 % (ref 0–5)
HCT: 41.9 % (ref 36.0–46.0)
Hemoglobin: 14.1 g/dL (ref 12.0–15.0)
LYMPHS ABS: 1.4 10*3/uL (ref 0.7–4.0)
Lymphocytes Relative: 18 % (ref 12–46)
MCH: 29.6 pg (ref 26.0–34.0)
MCHC: 33.7 g/dL (ref 30.0–36.0)
MCV: 88 fL (ref 78.0–100.0)
Monocytes Absolute: 0.7 10*3/uL (ref 0.1–1.0)
Monocytes Relative: 9 % (ref 3–12)
Neutro Abs: 5.8 10*3/uL (ref 1.7–7.7)
Neutrophils Relative %: 71 % (ref 43–77)
PLATELETS: 201 10*3/uL (ref 150–400)
RBC: 4.76 MIL/uL (ref 3.87–5.11)
RDW: 12.4 % (ref 11.5–15.5)
WBC: 8 10*3/uL (ref 4.0–10.5)

## 2015-02-09 LAB — BASIC METABOLIC PANEL
Anion gap: 6 (ref 5–15)
BUN: 11 mg/dL (ref 6–23)
CALCIUM: 9.5 mg/dL (ref 8.4–10.5)
CO2: 29 mmol/L (ref 19–32)
Chloride: 107 mmol/L (ref 96–112)
Creatinine, Ser: 0.74 mg/dL (ref 0.50–1.10)
GFR calc Af Amer: >90 mL/min (ref >90)
Glucose, Bld: 97 mg/dL (ref 70–99)
POTASSIUM: 4 mmol/L (ref 3.5–5.1)
Sodium: 142 mmol/L (ref 135–145)

## 2015-02-09 NOTE — Progress Notes (Signed)
LABS FOR CBCD, CMP 

## 2015-02-09 NOTE — Patient Instructions (Signed)
[place] at [facility]  Discharge Instructions:  Labs work today. Labs in 6 months and 12 months. Return in 1 year for follow-up. Please report to ED or [place] for any easy bruising, bleeding that does not stop, petechial rash, or other signs and symptoms of low platelets.   We can see you sooner if needed.  Please call if needed. _______________________________________________________________  Thank you for choosing [place] at [facility] to provide your oncology and hematology care.  To afford each patient quality time with our providers, please arrive at least 15 minutes before your scheduled appointment.  You need to re-schedule your appointment if you arrive 10 or more minutes late.  We strive to give you quality time with our providers, and arriving late affects you and other patients whose appointments are after yours.  Also, if you no show three or more times for appointments you may be dismissed from the clinic.  Again, thank you for choosing [place] at [place] hope is that these requests will allow you access to exceptional care and in a timely manner. _______________________________________________________________  If you have questions after your visit, please contact our office at [phone] between the hours of 8:30 a.m. and 5:00 p.m. Voicemails left after 4:30 p.m. will not be returned until the following business day. _______________________________________________________________  For prescription refill requests, have your pharmacy contact our office. _______________________________________________________________  Recommendations made by the consultant and any test results will be sent to your referring physician. _______________________________________________________________

## 2015-08-01 ENCOUNTER — Ambulatory Visit (HOSPITAL_COMMUNITY)
Admission: RE | Admit: 2015-08-01 | Discharge: 2015-08-01 | Disposition: A | Discharge status: Home / Self Care | Payer: BLUE CROSS/BLUE SHIELD | Source: Ambulatory Visit | Attending: Preventative Medicine | Admitting: Preventative Medicine

## 2015-08-01 ENCOUNTER — Other Ambulatory Visit (HOSPITAL_COMMUNITY): Payer: Self-pay | Admitting: Preventative Medicine

## 2015-08-01 DIAGNOSIS — K5730 Diverticulosis of large intestine without perforation or abscess without bleeding: Secondary | ICD-10-CM | POA: Diagnosis not present

## 2015-08-01 DIAGNOSIS — R1032 Left lower quadrant pain: Secondary | ICD-10-CM | POA: Diagnosis not present

## 2015-08-01 DIAGNOSIS — N200 Calculus of kidney: Secondary | ICD-10-CM

## 2015-08-16 ENCOUNTER — Encounter (HOSPITAL_COMMUNITY): Payer: BLUE CROSS/BLUE SHIELD | Attending: Hematology & Oncology

## 2015-08-16 DIAGNOSIS — D693 Immune thrombocytopenic purpura: Secondary | ICD-10-CM

## 2015-08-16 LAB — CBC WITH DIFFERENTIAL/PLATELET
Basophils Absolute: 0 10*3/uL (ref 0.0–0.1)
Basophils Relative: 0 % (ref 0–1)
EOS ABS: 0.2 10*3/uL (ref 0.0–0.7)
Eosinophils Relative: 3 % (ref 0–5)
HEMATOCRIT: 42.2 % (ref 36.0–46.0)
HEMOGLOBIN: 14.1 g/dL (ref 12.0–15.0)
LYMPHS ABS: 1.6 10*3/uL (ref 0.7–4.0)
Lymphocytes Relative: 26 % (ref 12–46)
MCH: 29.9 pg (ref 26.0–34.0)
MCHC: 33.4 g/dL (ref 30.0–36.0)
MCV: 89.4 fL (ref 78.0–100.0)
MONO ABS: 0.6 10*3/uL (ref 0.1–1.0)
MONOS PCT: 10 % (ref 3–12)
NEUTROS ABS: 3.8 10*3/uL (ref 1.7–7.7)
NEUTROS PCT: 61 % (ref 43–77)
Platelets: 178 10*3/uL (ref 150–400)
RBC: 4.72 MIL/uL (ref 3.87–5.11)
RDW: 12.4 % (ref 11.5–15.5)
WBC: 6.1 10*3/uL (ref 4.0–10.5)

## 2015-08-16 NOTE — Progress Notes (Signed)
Labs drawn

## 2015-10-10 ENCOUNTER — Other Ambulatory Visit: Payer: Self-pay

## 2015-10-10 DIAGNOSIS — Z1231 Encounter for screening mammogram for malignant neoplasm of breast: Secondary | ICD-10-CM

## 2015-11-11 ENCOUNTER — Ambulatory Visit
Admission: RE | Admit: 2015-11-11 | Discharge: 2015-11-11 | Disposition: A | Discharge status: Home / Self Care | Payer: BLUE CROSS/BLUE SHIELD | Source: Ambulatory Visit

## 2015-11-11 DIAGNOSIS — Z1231 Encounter for screening mammogram for malignant neoplasm of breast: Secondary | ICD-10-CM

## 2015-12-25 ENCOUNTER — Emergency Department (HOSPITAL_COMMUNITY): Payer: BLUE CROSS/BLUE SHIELD

## 2015-12-25 ENCOUNTER — Observation Stay (HOSPITAL_COMMUNITY)
Admission: EM | Admit: 2015-12-25 | Discharge: 2015-12-26 | Disposition: A | Discharge status: Home / Self Care | Payer: BLUE CROSS/BLUE SHIELD | Attending: Internal Medicine | Admitting: Internal Medicine

## 2015-12-25 ENCOUNTER — Encounter (HOSPITAL_COMMUNITY): Payer: Self-pay | Admitting: Emergency Medicine

## 2015-12-25 DIAGNOSIS — R0789 Other chest pain: Secondary | ICD-10-CM

## 2015-12-25 DIAGNOSIS — M79622 Pain in left upper arm: Secondary | ICD-10-CM | POA: Diagnosis not present

## 2015-12-25 DIAGNOSIS — D869 Sarcoidosis, unspecified: Secondary | ICD-10-CM | POA: Diagnosis present

## 2015-12-25 DIAGNOSIS — M25512 Pain in left shoulder: Secondary | ICD-10-CM | POA: Insufficient documentation

## 2015-12-25 DIAGNOSIS — Z79899 Other long term (current) drug therapy: Secondary | ICD-10-CM | POA: Diagnosis not present

## 2015-12-25 DIAGNOSIS — D693 Immune thrombocytopenic purpura: Secondary | ICD-10-CM | POA: Diagnosis present

## 2015-12-25 DIAGNOSIS — R0602 Shortness of breath: Secondary | ICD-10-CM | POA: Diagnosis not present

## 2015-12-25 DIAGNOSIS — R079 Chest pain, unspecified: Secondary | ICD-10-CM | POA: Diagnosis not present

## 2015-12-25 DIAGNOSIS — I10 Essential (primary) hypertension: Secondary | ICD-10-CM | POA: Diagnosis not present

## 2015-12-25 LAB — URINALYSIS, ROUTINE W REFLEX MICROSCOPIC
Bilirubin Urine: NEGATIVE
GLUCOSE, UA: NEGATIVE mg/dL
HGB URINE DIPSTICK: NEGATIVE
Ketones, ur: NEGATIVE mg/dL
LEUKOCYTES UA: NEGATIVE
Nitrite: NEGATIVE
PH: 6 (ref 5.0–8.0)
Protein, ur: NEGATIVE mg/dL
Specific Gravity, Urine: 1.01 (ref 1.005–1.030)

## 2015-12-25 LAB — CBC WITH DIFFERENTIAL/PLATELET
BASOS ABS: 0 10*3/uL (ref 0.0–0.1)
Basophils Relative: 0 %
Eosinophils Absolute: 0.1 10*3/uL (ref 0.0–0.7)
Eosinophils Relative: 2 %
HEMATOCRIT: 40 % (ref 36.0–46.0)
Hemoglobin: 13.5 g/dL (ref 12.0–15.0)
LYMPHS ABS: 1.6 10*3/uL (ref 0.7–4.0)
LYMPHS PCT: 28 %
MCH: 29.2 pg (ref 26.0–34.0)
MCHC: 33.8 g/dL (ref 30.0–36.0)
MCV: 86.6 fL (ref 78.0–100.0)
MONO ABS: 0.6 10*3/uL (ref 0.1–1.0)
Monocytes Relative: 12 %
NEUTROS ABS: 3.2 10*3/uL (ref 1.7–7.7)
Neutrophils Relative %: 58 %
Platelets: 195 10*3/uL (ref 150–400)
RBC: 4.62 MIL/uL (ref 3.87–5.11)
RDW: 12.2 % (ref 11.5–15.5)
WBC: 5.5 10*3/uL (ref 4.0–10.5)

## 2015-12-25 LAB — BASIC METABOLIC PANEL
Anion gap: 7 (ref 5–15)
BUN: 10 mg/dL (ref 6–20)
CHLORIDE: 108 mmol/L (ref 101–111)
CO2: 24 mmol/L (ref 22–32)
Calcium: 8.9 mg/dL (ref 8.9–10.3)
Creatinine, Ser: 0.74 mg/dL (ref 0.44–1.00)
GFR calc Af Amer: >60 mL/min (ref >60)
GFR calc non Af Amer: >60 mL/min (ref >60)
Glucose, Bld: 106 mg/dL — ABNORMAL HIGH (ref 65–99)
POTASSIUM: 4 mmol/L (ref 3.5–5.1)
SODIUM: 139 mmol/L (ref 135–145)

## 2015-12-25 LAB — PREGNANCY, URINE: Preg Test, Ur: NEGATIVE

## 2015-12-25 LAB — TROPONIN I

## 2015-12-25 MED ORDER — ENOXAPARIN SODIUM 40 MG/0.4ML ~~LOC~~ SOLN
40.0000 mg | SUBCUTANEOUS | Status: DC
  Filled 2015-12-25: qty 0.4

## 2015-12-25 MED ORDER — ACEBUTOLOL HCL 200 MG PO CAPS
200.0000 mg | ORAL_CAPSULE | Freq: Every day | ORAL | Status: DC
Start: 2015-12-26 — End: 2015-12-26
  Administered 2015-12-26: 200 mg via ORAL
  Filled 2015-12-25 (×3): qty 1

## 2015-12-25 MED ORDER — IOHEXOL 350 MG/ML SOLN
100.0000 mL | Freq: Once | INTRAVENOUS | Status: AC | PRN
  Administered 2015-12-25: 100 mL via INTRAVENOUS

## 2015-12-25 MED ORDER — ALPRAZOLAM 0.25 MG PO TABS: 0.2500 mg | ORAL_TABLET | Freq: Two times a day (BID) | ORAL | Status: DC | PRN

## 2015-12-25 MED ORDER — ACETAMINOPHEN 325 MG PO TABS: 650.0000 mg | ORAL_TABLET | ORAL | Status: DC | PRN

## 2015-12-25 MED ORDER — HYDROCODONE-ACETAMINOPHEN 5-325 MG PO TABS: 1.0000 | ORAL_TABLET | Freq: Four times a day (QID) | ORAL | Status: DC | PRN

## 2015-12-25 MED ORDER — ONDANSETRON HCL 4 MG/2ML IJ SOLN: 4.0000 mg | Freq: Four times a day (QID) | INTRAMUSCULAR | Status: DC | PRN

## 2015-12-25 NOTE — H&P (Addendum)
PCP:   Purvis Kilts, MD   Chief Complaint:  Chest pain  HPI:  40 year old female who  has a past medical history of Hypertension; ITP (idiopathic thrombocytopenic purpura) (06/26/2013); HELLP syndrome (06/26/2013); and Sarcoidosis ([place]) (06/26/2013). Today came to the hospital with 1 week history of chest pain. The pain she describes is bilaterally below the sternum and also involves left shoulder. Patient also feels intermittent shortness of breath. Patient admits to undergoing a lot of stress lately at her job. Though she denies anxiety and panic attacks. Patient also has history of intermittent palpitations. She denies cough, no nausea vomiting or diarrhea. No fever, dysuria urgency frequency of urination. Patient also complains of left shoulder pain, though she denies any injury to shoulder. Patient has history of sarcoidosis and ITP. Currently she is not taking any medications for these.  In the ED CT angiogram of the chest was done which ruled out pulmonary embolism.  Allergies:  No Known Allergies    Past Medical History  Diagnosis Date  . Hypertension   . 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 HELLP syndrome 06/26/2013    At end of first pregnancy  . Sarcoidosis ([place]) 06/26/2013    Documented by Dr. Arlyce Dice in 2004 by mediastinoscopy and bronchoscopy after an abnormal chest x-ray    Past Surgical History  Procedure Laterality Date  . Cesarean section    . Bone marrow biopsy      Prior to Admission medications   Medication Sig Start Date End Date Taking? Authorizing Provider  acebutolol (SECTRAL) 200 MG capsule Take 200 mg by mouth daily.   Yes Historical Provider, MD  Cetirizine-Pseudoephedrine (ZYRTEC-D PO) Take 1 tablet by mouth daily as needed (Allergies).    Yes Historical Provider, MD  ibuprofen (ADVIL,MOTRIN) 200 MG tablet Take 400 mg by mouth every 6 (six) hours as needed for mild pain.   Yes Historical Provider, MD    famotidine (PEPCID) 40 MG tablet Take 40 mg by mouth daily as needed.    Historical Provider, MD  potassium chloride SA (K-DUR,KLOR-CON) 20 MEQ tablet Take 1 tablet (20 mEq total) by mouth daily. 06/21/14 07/19/14  Baird Cancer, PA-C  predniSONE (DELTASONE) 20 MG tablet TAKE 1 TABLET BY MOUTH AS DIRECTED 01/29/14   Baird Cancer, PA-C    Social History:  reports that she has never smoked. She has never used smokeless tobacco. She reports that she does not drink alcohol or use illicit drugs.  Family History  Problem Relation Age of Onset  . Hypertension Father     Danley Danker Weights   12/25/15 1913  Weight: 55.339 kg (122 lb)    All the positives are listed in BOLD  Review of Systems:  HEENT: Headache, blurred vision, runny nose, sore throat Neck: Hypothyroidism, hyperthyroidism,,lymphadenopathy Chest : Shortness of breath, history of COPD, Asthma Heart : Chest pain, history of coronary arterey disease GI:  Nausea, vomiting, diarrhea, constipation, GERD GU: Dysuria, urgency, frequency of urination, hematuria Neuro: Stroke, seizures, syncope Psych: Depression, anxiety, hallucinations   Physical Exam: Blood pressure 118/87, pulse 80, temperature 98.1 F (36.7 C), temperature source Oral, resp. rate 20, height 5' 5.5" (1.664 m), weight 55.339 kg (122 lb), last menstrual period 12/12/2015, SpO2 100 %. Constitutional:   Patient is a well-developed and well-nourished female* in no acute distress and cooperative with exam. Head: Normocephalic and atraumatic Mouth: Mucus membranes moist Eyes: PERRL, EOMI, conjunctivae normal Neck: Supple, No Thyromegaly Cardiovascular: RRR, S1 normal,  S2 normal Pulmonary/Chest: CTAB, no wheezes, rales, or rhonchi Abdominal: Soft. Non-tender, non-distended, bowel sounds are normal, no masses, organomegaly, or guarding present.  Neurological: A&O x3, Strength is normal and symmetric bilaterally, cranial nerve II-XII are grossly intact, no focal motor  deficit, sensory intact to light touch bilaterally.  Extremities : No Cyanosis, Clubbing or Edema  Labs on Admission:  Basic Metabolic Panel:  Recent Labs Lab 12/25/15 1955  NA 139  K 4.0  CL 108  CO2 24  GLUCOSE 106*  BUN 10  CREATININE 0.74  CALCIUM 8.9   CBC:  Recent Labs Lab 12/25/15 1955  WBC 5.5  NEUTROABS 3.2  HGB 13.5  HCT 40.0  MCV 86.6  PLT 195   Cardiac Enzymes:  Recent Labs Lab 12/25/15 1955  TROPONINI <0.03    Radiological Exams on Admission: Ct Angio Chest Pe W/cm &/or Wo Cm  12/25/2015  CLINICAL DATA:  Acute onset of left-sided shoulder pain and intermittent shortness of breath. Sternal pain. Current history of sarcoidosis. Initial encounter. EXAM: CT ANGIOGRAPHY CHEST WITH CONTRAST TECHNIQUE: Multidetector CT imaging of the chest was performed using the standard protocol during bolus administration of intravenous contrast. Multiplanar CT image reconstructions and MIPs were obtained to evaluate the vascular anatomy. CONTRAST:  1102m OMNIPAQUE IOHEXOL 350 MG/ML SOLN COMPARISON:  Chest radiograph from 10/07/2014 FINDINGS: There is no evidence of pulmonary embolus. The lungs appear clear bilaterally. There is no evidence of significant focal consolidation, pleural effusion or pneumothorax. No masses are identified; no abnormal focal contrast enhancement is seen. The mediastinum is unremarkable appearance. No mediastinal lymphadenopathy is seen. No pericardial effusion is identified. The great vessels are grossly unremarkable in appearance. No axillary lymphadenopathy is seen. The visualized portions of the thyroid gland are unremarkable in appearance. The visualized portions of the liver and spleen are unremarkable. No acute osseous abnormalities are seen. Review of the MIP images confirms the above findings. IMPRESSION: 1. No evidence of pulmonary embolus. 2. Lungs clear bilaterally. Electronically Signed   By: JGarald BaldingM.D.   On: 12/25/2015 21:28   Dg  Shoulder Left  12/25/2015  CLINICAL DATA:  Pain.  No known injury EXAM: LEFT SHOULDER - 2+ VIEW COMPARISON:  None. FINDINGS: Frontal, Y scapular, and axillary images were obtained. There is no demonstrable fracture or dislocation. Joint spaces appear intact. No erosive change. Visualized left lung clear. IMPRESSION: No demonstrable fracture or dislocation.  No apparent arthropathy. Electronically Signed   By: WLowella GripIII M.D.   On: 12/25/2015 20:48    EKG: Independently reviewed.  Nonspecific ST-T abnormalities in inferior leads   Assessment/Plan Active Problems:   ITP (idiopathic thrombocytopenic purpura)   Sarcoidosis ([facility])   Chest pain  Chest pain Admit the patient in telemetry, cycle the cardiac enzymes 3 Chest pain appears atypical  Left shoulder pain X-ray shoulder is negative. Start Vicodin when necessary for left shoulder pain  Hypertension Blood pressure is stable. Continue Acebutolol  DVT prophylaxis Lovenox  Code status: Full code  Family discussion: No family present at bedside   Time Spent on Admission: 60 min  [facility] Pager: [phone]/15/2017, 10:36 PM  If 7PM-7AM, please contact night-coverage  [email or web address]  Password [email or web address]

## 2015-12-25 NOTE — Discharge Instructions (Signed)
°Emergency Department Resource Guide °1) Find a Doctor and Pay Out of Pocket °Although you won't have to find out who is covered by your insurance plan, it is a good idea to ask around and get recommendations. You will then need to call the office and see if the doctor you have chosen will accept you as a new patient and what types of options they offer for patients who are self-pay. Some doctors offer discounts or will set up payment plans for their patients who do not have insurance, but you will need to ask so you aren't surprised when you get to your appointment. ° °2) Contact Your Local Health Department °Not all health departments have doctors that can see patients for sick visits, but many do, so it is worth a call to see if yours does. If you don't know where your local health department is, you can check in your phone book. The CDC also has a tool to help you locate your state's health department, and many state websites also have listings of all of their local health departments. ° °3) Find a Walk-in Clinic °If your illness is not likely to be very severe or complicated, you may want to try a walk in clinic. These are popping up all over the country in pharmacies, drugstores, and shopping centers. They're usually staffed by nurse practitioners or physician assistants that have been trained to treat common illnesses and complaints. They're usually fairly quick and inexpensive. However, if you have serious medical issues or chronic medical problems, these are probably not your best option. ° °No Primary Care Doctor: °- Call [facility] Connect at  832-8000 - they can help you locate a primary care doctor that  accepts your insurance, provides certain services, etc. °- Physician Referral Service- 1-800-533-3463 ° °Chronic Pain Problems: °Organization         Address  Phone   Notes  °[facility]  ([phone]) [phone] Patients need to be referred by their primary care doctor.  ° °Medication  Assistance: °Organization         Address  Phone   Notes  °[place] [facility] [address] °[place], [place] [address] (336) 641-8030 --Must be a resident of [place] °-- Must have NO insurance coverage whatsoever (no Medicaid/ Medicare, etc.) °-- The pt. MUST have a primary care doctor that directs their care regularly and follows them in the community °  °[facility]  (866) 331-1348   °[employer]  (888) 892-1162   ° °Agencies that provide inexpensive medical care: °Organization         Address  Phone   Notes  °[facility]  (336) 832-8035   °[facility]    (336) 832-7272   °[facility] [address] °[place], [place] [address] (336) 832-4777   °[facility] [address], °[place] (336) 271-4999   °[facility]    (336) 373-0678   °[facility]    (336) 272-1050   °[facility] ° [address], [place] Phone:  (336) 832-4444, Fax:  (336) 832-4440 Hours of Operation:  9 am - 6 pm, M-F.  Also accepts Medicaid/Medicare and self-pay.  °[facility] ° [address], [place] Phone: (336) [phone], Fax: (336) [phone]. Hours of Operation:  8:30 am - 5:30 pm, M-F.  Also accepts Medicaid and self-pay.  °[facility] [place] [address], [place] Phone: (336) 878-6027   °[facility] [address], [place], [place] (336)723-1848, Ext. 123 Mondays & Thursdays: 7-9 AM.  First 15 patients are seen on a first come, first serve basis. °  ° °Medicaid-accepting [place] Providers: ° °Organization         Address  Phone   Notes  °[facility] [address], [place] (336) 641-2100 Also accepts self-pay patients.  °[facility] [address], [place] ° (336) 856-9996   °[facility] [address], [place]  (336) 288-8857   °[facility] [address] [place] [address], [place] (336) 299-7000   °Veita Bland [address], [place]  ° (336) 373-1557 Only accepts [place] Access Medicaid patients after they have their name applied to their card.  ° °Self-Pay (no insurance) in [place]: ° °Organization         Address  Phone   Notes  °Sickle Cell Patients, [facility] [address], [place] (336) 832-1970   °[facility] [address], [place] (336) 832-4400   °[facility] ° [address], [place] (336) 992-4800   °[facility]/Dr. Osei-Bonsu ° [address] [place] [address], [place] or [address], [place] (336) [phone] Phone number for both [place] and [place] locations is the same.  °[facility] [address], [place] (336) 299-0000   °[facility] [address] [place] [address], [place] or [address] (336) 852-7530 °(336) 878-2260   °[facility] [address], [place] (336) 350-1642, phone; (336) 294-5005, fax Sees patients 1st and 3rd Saturday of every month.  Must not qualify for public or private insurance (i.e. Medicaid, Medicare, [place] Health Choice, Veterans' Benefits) • Household income should be no more than 200% of the poverty level •The clinic cannot treat you if you are pregnant or think you are pregnant • Sexually transmitted diseases are not treated at the clinic.  ° ° °Dental Care: °Organization         Address  Phone  Notes  °[place] [facility] [address], [place] (336) 641-6152 Accepts children up to age 21 who are enrolled in Medicaid or [place] Health Choice; pregnant women with a Medicaid card; and children who have applied for Medicaid or [place] Health Choice, but were declined, whose parents can pay a reduced fee at time of service.  °[place]  [facility] [place]  [address], [place] (336) 641-7733 Accepts children up to age 21 who are enrolled in Medicaid or [place] Health Choice; pregnant women with a Medicaid card; and children who have applied for Medicaid or [place] Health Choice, but were declined, whose parents can pay a reduced fee at time of service.  °[facility] ° [address],  (336) 641-4533 Patients are seen by appointment only. Walk-ins are not accepted. [facility] will see patients 18 years of age and older. °Monday - Tuesday (8am-5pm) °Most Wednesdays (8:30-5pm) °$30 per visit, cash only  °[facility] ° [address], [place] (336) 641-4533 Patients are seen by appointment only. Walk-ins are not accepted. [facility] will see patients 18 years of age and older. °One   Wednesday Evening (Monthly: Volunteer Based).  $30 per visit, cash only  °[facility]  (919) 537-3737 for adults; Children under age 4, call Graduate Pediatric Dentistry at ([phone]) [phone]. Children aged 4-14, please call ([phone]) [phone] to request a pediatric application. ° Dental services are provided in all areas of dental care including fillings, crowns and bridges, complete and partial dentures, implants, gum treatment, root canals, and extractions. Preventive care is also provided. Treatment is provided to both adults and children. °Patients are selected via a lottery and there is often a waiting list. °  °[facility] [address], °[place] ° (336) 763-8833 [email or web address] °  °[facility] [address], [place], [place] (336)723-1848, Ext. 123 Second and Fourth Thursday of each month, opens at 6:30 AM; Clinic ends at 9 AM.  Patients are seen on a first-come first-served basis, and a limited number are seen during each clinic.  ° °[facility] ° [address], [place], [place] (336) 723-7904    Eligibility Requirements °You must have lived in [place], [place], or [place] counties for at least the last three months. °  You cannot be eligible for state or federal sponsored healthcare insurance, including Veterans Administration, Medicaid, or Medicare. °  You generally cannot be eligible for healthcare insurance through your employer.  °  How to apply: °Eligibility screenings are held every Tuesday and Wednesday afternoon from 1:00 pm until 4:00 pm. You do not need an appointment for the interview!  °[facility] [address], [place], [place] 336-631-2330   °[facility]  336-342-8273   °[facility]  336-703-3100   °[facility]  336-570-6415   ° °Behavioral Health Resources in the Community: °Intensive Outpatient Programs °Organization         Address  Phone  Notes  °[facility] [address], [place], [place] 336-878-6098   °[facility] [address], [place], [place] 336-832-9800   °[facility] [address], [place], [place] ° 336-882-2125   °[facility] [address],  °[place], [place] 1-800-853-5163 or 336-641-4981   °Substance Abuse Resources °Organization         Address  Phone  Notes  °[facility]  336-882-2125   °[facility]  336-784-9470   °[facility]  336-285-9073   °[facility]  336-845-3988   °Residential & Outpatient Substance Abuse Program  1-800-659-3381   °Psychological Services °Organization         Address  Phone  Notes  °[facility]  336- 832-9600   °[facility]  336- 378-7881   °[facility] [address], [place] 1-800-853-5163 or 336-641-4981   ° °Mobile Crisis Teams °Organization         Address  Phone  Notes  °[facility], Mobile Crisis Care Unit  1-877-626-1772   °Assertive °[facility] ° [address]  [place], [place] 336-834-9664   °Sharon DeEsch [address] °[place] 336-554-5454   ° °Self-Help/Support Groups °Organization         Address  Phone             Notes  °[facility] [place] - variety of support groups  336- 373-1402 Call for more information  °Narcotics Anonymous (NA), [facility] [address], °[place]   2 meetings at this location  ° °  Residential Treatment Programs Organization         Address  Phone  Notes  [facility] [address]    [place]  1-(803) 723-7619   [facility]  [address], [place] [id number], [place], [place]   [place], [place] [phone] Admissions: 8am-3pm M-F  [facility] [place] [address],    [place], [place] [id number]   [facility] [address] [place], [place], [place]   [facility] [address]  [place], [place]   [facility] - Intensive Outpatient [address], [place], [place]   [facility] ([place]  [place], [place] 1-825-732-8290 or 401 545 2046   [facility] [address] [place], [place] Accepts Medicaid  [facility] [place] [address]  [place] 1-667-388-1870 Substance Abuse/Addiction Treatment   [facility]         Address  Phone  Notes  [facility]  [phone]   Domenic Schwab, PhD [address] [place], [place]   (303)020-2096 or 520-631-1069   [facility] [place], [place] (515) 880-7877   [facility] [address], [place], [place] (916)186-1873 Insurance/Medicaid/sponsorship through [facility] [address] [place]                                    [place], [place] [phone] [place] [address], [place] [phone]    Dr. Adele Schilder  ([phone]   [facility] Dept. 1) [address], [place] 2) [place] 3)  [address] [address], [place] [phone] 380-692-2122  (332)[phone]   [place] (340) [phone] or (647) [phone] (After Hours)      Take your usual prescriptions as previously directed.  Call your regular medical doctor and the Cardiologist tomorrow to schedule a follow up appointment within the next 1 to 2 days.  Return to the Emergency Department immediately sooner if worsening.

## 2015-12-25 NOTE — ED Provider Notes (Signed)
CSN: [id number]     Arrival date & time 12/25/15  1900 History   First MD Initiated Contact with Patient 12/25/15 1920     Chief Complaint  Patient presents with  . Shoulder Pain  . Shortness of Breath      HPI  Pt was seen at 1935. Per pt, c/o gradual onset and persistence of multiple intermittent episodes of left upper lateral arm "pain" for the past 1 week. Describes the pain as "sharp." Pt states she also intermittently feels "SOB" and has had upper sternum "pain." All symptoms do not necessarily occur together. Left upper lateral arm pain lasts for several hours each episode. Upper sternum "pain" occurs when she "walks on the treadmill." Pt was evaluated by her PMD 2 weeks ago for routine physical, and was told "my EKG had something funny on it but that it was still normal." Pt states since that time, she also has had intermittent "palpitations." Denies cough, no abd pain, no N/V/D, no neck or back pain, no fevers, no rash, no focal motor weakness, no tingling/numbness in extremities, no injury.    Past Medical History  Diagnosis Date  . Hypertension   . 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 HELLP syndrome 06/26/2013    At end of first pregnancy  . Sarcoidosis ([place]) 06/26/2013    Documented by Dr. Arlyce Dice in 2004 by mediastinoscopy and bronchoscopy after an abnormal chest x-ray   Past Surgical History  Procedure Laterality Date  . Cesarean section    . Bone marrow biopsy     Family History  Problem Relation Age of Onset  . Hypertension Father    Social History  Substance Use Topics  . Smoking status: Never Smoker   . Smokeless tobacco: Never Used  . Alcohol Use: No    Review of Systems ROS: Statement: All systems negative except as marked or noted in the HPI; Constitutional: Negative for fever and chills. ; ; Eyes: Negative for eye pain, redness and discharge. ; ; ENMT: Negative for ear pain, hoarseness, nasal congestion, sinus pressure and  sore throat. ; ; Cardiovascular: +CP, SOB. Negative for palpitations, diaphoresis, and peripheral edema. ; ; Respiratory: Negative for cough, wheezing and stridor. ; ; Gastrointestinal: Negative for nausea, vomiting, diarrhea, abdominal pain, blood in stool, hematemesis, jaundice and rectal bleeding. . ; ; Genitourinary: Negative for dysuria, flank pain and hematuria. ; ; Musculoskeletal: +left lateral shoulder and upper arm pain. Negative for back pain and neck pain. Negative for swelling and trauma.; ; Skin: Negative for pruritus, rash, abrasions, blisters, bruising and skin lesion.; ; Neuro: Negative for headache, lightheadedness and neck stiffness. Negative for weakness, altered level of consciousness , altered mental status, extremity weakness, paresthesias, involuntary movement, seizure and syncope.      Allergies  Review of patient's allergies indicates no known allergies.  Home Medications   Prior to Admission medications   Medication Sig Start Date End Date Taking? Authorizing Provider  acebutolol (SECTRAL) 200 MG capsule Take 200 mg by mouth daily.   Yes Historical Provider, MD  Cetirizine-Pseudoephedrine (ZYRTEC-D PO) Take 1 tablet by mouth daily as needed (Allergies).    Yes Historical Provider, MD  ibuprofen (ADVIL,MOTRIN) 200 MG tablet Take 400 mg by mouth every 6 (six) hours as needed for mild pain.   Yes Historical Provider, MD  famotidine (PEPCID) 40 MG tablet Take 40 mg by mouth daily as needed.    Historical Provider, MD  potassium chloride SA (K-DUR,KLOR-CON) 20  MEQ tablet Take 1 tablet (20 mEq total) by mouth daily. 06/21/14 07/19/14  Baird Cancer, PA-C  predniSONE (DELTASONE) 20 MG tablet TAKE 1 TABLET BY MOUTH AS DIRECTED 01/29/14   Manon Hilding [name], PA-C   BP 137/91 mmHg  Pulse 81  Temp(Src) 98.1 F (36.7 C) (Oral)  Resp 18  Ht 5' 5.5" (1.664 m)  Wt 122 lb (55.339 kg)  BMI 19.99 kg/m2  SpO2 100%  LMP 12/12/2015 (Approximate) Physical Exam  1940: Physical  examination:  Nursing notes reviewed; Vital signs and O2 SAT reviewed;  Constitutional: Well developed, Well nourished, Well hydrated, In no acute distress; Head:  Normocephalic, atraumatic; Eyes: EOMI, PERRL, No scleral icterus; ENMT: Mouth and pharynx normal, Mucous membranes moist; Neck: Supple, Full range of motion, No lymphadenopathy; Cardiovascular: Regular rate and rhythm, No murmur, rub, or gallop; Respiratory: Breath sounds clear & equal bilaterally, No rales, rhonchi, wheezes.  Speaking full sentences with ease, Normal respiratory effort/excursion; Chest: Nontender, Movement normal; Abdomen: Soft, Nontender, Nondistended, Normal bowel sounds; Genitourinary: No CVA tenderness; Spine:  No midline CS, TS, LS tenderness.;; Extremities: Pulses normal, No deformity. No rash. No tenderness, No edema, No calf edema or asymmetry.; Neuro: AA&Ox3, Major CN grossly intact.  Speech clear. No gross focal motor or sensory deficits in extremities. Climbs on and off stretcher easily by herself. Gait steady.; Skin: Color normal, Warm, Dry.   ED Course  Procedures (including critical care time) Labs Review   Imaging Review  I have personally reviewed and evaluated these images and lab results as part of my medical decision-making.   EKG Interpretation   Date/Time:  Sunday December 25 2015 19:17:38 EST Ventricular Rate:  80 PR Interval:  139 QRS Duration: 85 QT Interval:  362 QTC Calculation: 418 R Axis:   46 Text Interpretation:  Sinus rhythm Nonspecific ST and T wave abnormality  Inferior leads When compared with ECG of 09/11/2000 Nonspecific ST and T  wave abnormality Anterior leads is no longer Present Otherwise no  significant change Confirmed by [facility]  MD, Nunzio Cory [phone]) on 12/25/2015  7:48:53 PM      MDM  MDM Reviewed: previous chart, nursing note and vitals Reviewed previous: labs and ECG Interpretation: labs, ECG, x-ray and CT scan      Results for orders placed or performed  during the hospital encounter of 85/27/78  Basic metabolic panel  Result Value Ref Range   Sodium 139 135 - 145 mmol/L   Potassium 4.0 3.5 - 5.1 mmol/L   Chloride 108 101 - 111 mmol/L   CO2 24 22 - 32 mmol/L   Glucose, Bld 106 (H) 65 - 99 mg/dL   BUN 10 6 - 20 mg/dL   Creatinine, Ser 0.74 0.44 - 1.00 mg/dL   Calcium 8.9 8.9 - 10.3 mg/dL   GFR calc non Af Amer >60 >60 mL/min   GFR calc Af Amer >60 >60 mL/min   Anion gap 7 5 - 15  Troponin I  Result Value Ref Range   Troponin I <0.03 <0.031 ng/mL  CBC with Differential  Result Value Ref Range   WBC 5.5 4.0 - 10.5 K/uL   RBC 4.62 3.87 - 5.11 MIL/uL   Hemoglobin 13.5 12.0 - 15.0 g/dL   HCT 40.0 36.0 - 46.0 %   MCV 86.6 78.0 - 100.0 fL   MCH 29.2 26.0 - 34.0 pg   MCHC 33.8 30.0 - 36.0 g/dL   RDW 12.2 11.5 - 15.5 %   Platelets 195 150 - 400  K/uL   Neutrophils Relative % 58 %   Neutro Abs 3.2 1.7 - 7.7 K/uL   Lymphocytes Relative 28 %   Lymphs Abs 1.6 0.7 - 4.0 K/uL   Monocytes Relative 12 %   Monocytes Absolute 0.6 0.1 - 1.0 K/uL   Eosinophils Relative 2 %   Eosinophils Absolute 0.1 0.0 - 0.7 K/uL   Basophils Relative 0 %   Basophils Absolute 0.0 0.0 - 0.1 K/uL  Urinalysis, Routine w reflex microscopic  Result Value Ref Range   Color, Urine YELLOW YELLOW   APPearance CLEAR CLEAR   Specific Gravity, Urine 1.010 1.005 - 1.030   pH 6.0 5.0 - 8.0   Glucose, UA NEGATIVE NEGATIVE mg/dL   Hgb urine dipstick NEGATIVE NEGATIVE   Bilirubin Urine NEGATIVE NEGATIVE   Ketones, ur NEGATIVE NEGATIVE mg/dL   Protein, ur NEGATIVE NEGATIVE mg/dL   Nitrite NEGATIVE NEGATIVE   Leukocytes, UA NEGATIVE NEGATIVE  Pregnancy, urine  Result Value Ref Range   Preg Test, Ur NEGATIVE NEGATIVE   Ct Angio Chest Pe W/cm &/or Wo Cm 12/25/2015  CLINICAL DATA:  Acute onset of left-sided shoulder pain and intermittent shortness of breath. Sternal pain. Current history of sarcoidosis. Initial encounter. EXAM: CT ANGIOGRAPHY CHEST WITH CONTRAST TECHNIQUE:  Multidetector CT imaging of the chest was performed using the standard protocol during bolus administration of intravenous contrast. Multiplanar CT image reconstructions and MIPs were obtained to evaluate the vascular anatomy. CONTRAST:  15m OMNIPAQUE IOHEXOL 350 MG/ML SOLN COMPARISON:  Chest radiograph from 10/07/2014 FINDINGS: There is no evidence of pulmonary embolus. The lungs appear clear bilaterally. There is no evidence of significant focal consolidation, pleural effusion or pneumothorax. No masses are identified; no abnormal focal contrast enhancement is seen. The mediastinum is unremarkable appearance. No mediastinal lymphadenopathy is seen. No pericardial effusion is identified. The great vessels are grossly unremarkable in appearance. No axillary lymphadenopathy is seen. The visualized portions of the thyroid gland are unremarkable in appearance. The visualized portions of the liver and spleen are unremarkable. No acute osseous abnormalities are seen. Review of the MIP images confirms the above findings. IMPRESSION: 1. No evidence of pulmonary embolus. 2. Lungs clear bilaterally. Electronically Signed   By: JGarald BaldingM.D.   On: 12/25/2015 21:28   Dg Shoulder Left 12/25/2015  CLINICAL DATA:  Pain.  No known injury EXAM: LEFT SHOULDER - 2+ VIEW COMPARISON:  None. FINDINGS: Frontal, Y scapular, and axillary images were obtained. There is no demonstrable fracture or dislocation. Joint spaces appear intact. No erosive change. Visualized left lung clear. IMPRESSION: No demonstrable fracture or dislocation.  No apparent arthropathy. Electronically Signed   By: WLowella GripIII M.D.   On: 12/25/2015 20:48    2145:  Pt informed re: dx testing results, and that I recommend observation admission for further evaluation.  Pt refuses admission.  I encouraged pt to stay, continues to refuse.  Pt makes her own medical decisions.  Risks of AMA explained to pt, including, but not limited to:  stroke, heart  attack, cardiac arrythmia ("irregular heart rate/beat"), "passing out," temporary and/or permanent disability, death.  Pt verb understanding and continue to refuse admission, understanding the consequences of her decision.  I encouraged pt to follow up with her PMD tomorrow and return to the ED immediately if symptoms return, worsen, or for any other concerns.  Pt verb understanding, agreeable.  2200:  Pt now states she will stay for observation admission. T/C to [facility]. LDarrick Meigs case discussed, including:  HPI,  pertinent PM/SHx, VS/PE, dx testing, ED course and treatment:  Agreeable to admit, requests to write temporary orders, obtain observation tele bed to team APAdmits.   Francine Graven, DO 12/28/15 1655

## 2015-12-25 NOTE — ED Notes (Signed)
Pt states that for the last week she has been having left shoulder pain, occasional shortness of breath, and pain in her sternum that comes and goes.

## 2015-12-26 DIAGNOSIS — R0789 Other chest pain: Secondary | ICD-10-CM

## 2015-12-26 DIAGNOSIS — D869 Sarcoidosis, unspecified: Secondary | ICD-10-CM

## 2015-12-26 DIAGNOSIS — D693 Immune thrombocytopenic purpura: Secondary | ICD-10-CM | POA: Diagnosis not present

## 2015-12-26 LAB — TROPONIN I: Troponin I: <0.03 ng/mL (ref <0.031)

## 2015-12-26 NOTE — Progress Notes (Signed)
Pt's IV catheter removed and intact. Pt's IV site clean dry and intact. Discharge instructions, medications and follow up appointments reviewed and discussed with patient. Pt verbalized understanding of medications and discharge instructions. All questions were answered and no further questions at this time. Pt in stable condition and in no acute distress at time of discharge. Pt escorted by RN.

## 2015-12-26 NOTE — Discharge Summary (Signed)
Discharge Summary  Emma Smith Gilles [id number] DOB: 1976/08/07  PCP: Purvis Kilts, MD  Admit date: 12/25/2015 Discharge date: 12/26/2015  Time spent: 25 minutes   Recommendations for Outpatient Follow-up:  1. Patient recommended to use stress free technique such as personal time and meditation 2. Follow-up with PCP as needed  Discharge Diagnoses:  Active Hospital Problems   Diagnosis Date Noted  . Chest pain 12/25/2015  . Sarcoidosis ([place]) 06/26/2013  . 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    Discharge Condition: Improved, being discharged home   Diet recommendation: Regular   Filed Weights   12/25/15 1911 12/25/15 2341  Weight: 55.339 kg (122 lb) 53.978 kg (119 lb)    History of present illness:  Patient is a 40 year old female past medical history of sarcoidosis and ITP who had noted for the past few days that she started having bilateral chest heaviness radiating from one shoulder across her collarbones to the other. Intermittent.Associate shortness of breath. Really nonradiating elsewhere. When she mentioned this to her family became concerned and she was sent into the emergency room. EKG and initial cardiac markers negative. Patient observed overnight under hospitalist service   Hospital Course:  Active Problems:   ITP (idiopathic thrombocytopenic purpura): Platelets stable during this hospitalization.    Sarcoidosis ([place]): Stable during this hospitalization    Chest pain: Enzymes 3 negative. No events on telemetry. Vital signs stable.  Given no family history, no previous medical history, no risk factors and normal lab work, patient with heart score of 0. 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 It is more stress related and advised patient about stress relieving techniques such as a little personal time every day or meditation. She says she will look into that.   Procedures:  None   Consultations:  None   Discharge Exam: BP 118/72 mmHg  Pulse 83  Temp(Src) 98.5 F (36.9 C) (Oral)  Resp 20  Ht 5\' 5"  (1.651 m)  Wt 53.978 kg (119 lb)  BMI 19.80 kg/m2  SpO2 100%  LMP 12/12/2015 (Approximate)  General: Alert and oriented 3, no acute distress  Cardiovascular: Regular rate and rhythm, S1-S2  Respiratory: Clear to auscultation bilaterally   Discharge Instructions You were cared for by a hospitalist during your hospital stay. If you have any questions about your discharge medications or the care you received while you were in the hospital after you are discharged, you can call the unit and asked to speak with the hospitalist on call if the hospitalist that took care of you is not available. Once you are discharged, your primary care physician will handle any further medical issues. Please note that NO REFILLS for any discharge medications will be authorized once you are discharged, as it is imperative that you return to your primary care physician (or establish a relationship with a primary care physician if you do not have one) for your aftercare needs so that they can reassess your need for medications and monitor your lab values.  Discharge Instructions    Diet general    Complete by:  As directed      Increase activity slowly    Complete  by:  As directed             Medication List    STOP taking these medications        potassium chloride SA 20 MEQ tablet  Commonly known as:  K-DUR,KLOR-CON      TAKE these medications        acebutolol 200 MG capsule  Commonly known as:  SECTRAL  Take 200 mg by mouth daily.     famotidine 40 MG tablet  Commonly known as:  PEPCID  Take 40 mg by mouth daily as needed.       ibuprofen 200 MG tablet  Commonly known as:  ADVIL,MOTRIN  Take 400 mg by mouth every 6 (six) hours as needed for mild pain.     ZYRTEC-D PO  Take 1 tablet by mouth daily as needed (Allergies).       No Known Allergies     Follow-up Information    Follow up with [facility]. Schedule an appointment as soon as possible for a visit in 2 days.   Specialty:  Cardiology   Contact information:   [place] [phone]      Follow up with Carlyle Dolly, MD.   Specialty:  Cardiology   Contact information:   [address] [place] [address] [phone]       Follow up with Herminio Commons, MD.   Specialty:  Cardiology   Contact information:   [place] [address] [phone]       Follow up with Purvis Kilts, MD. Schedule an appointment as soon as possible for a visit in 2 days.   Specialty:  Family Medicine   Contact information:   [address] [place] [id number] [phone]        The results of significant diagnostics from this hospitalization (including imaging, microbiology, ancillary and laboratory) are listed below for reference.    Significant Diagnostic Studies: Ct Angio Chest Pe W/cm &/or Wo Cm  12/25/2015  CLINICAL DATA:  Acute onset of left-sided shoulder pain and intermittent shortness of breath. Sternal pain. Current history of sarcoidosis. Initial encounter. EXAM: CT ANGIOGRAPHY CHEST WITH CONTRAST TECHNIQUE: Multidetector CT imaging of the chest was performed using the standard protocol during bolus administration of intravenous contrast. Multiplanar CT image reconstructions and MIPs were obtained to evaluate the vascular anatomy. CONTRAST:  145mL OMNIPAQUE IOHEXOL 350 MG/ML SOLN COMPARISON:  Chest radiograph from 10/07/2014 FINDINGS: There is no evidence of pulmonary embolus. The lungs appear clear bilaterally. There is no evidence of significant focal  consolidation, pleural effusion or pneumothorax. No masses are identified; no abnormal focal contrast enhancement is seen. The mediastinum is unremarkable appearance. No mediastinal lymphadenopathy is seen. No pericardial effusion is identified. The great vessels are grossly unremarkable in appearance. No axillary lymphadenopathy is seen. The visualized portions of the thyroid gland are unremarkable in appearance. The visualized portions of the liver and spleen are unremarkable. No acute osseous abnormalities are seen. Review of the MIP images confirms the above findings. IMPRESSION: 1. No evidence of pulmonary embolus. 2. Lungs clear bilaterally. Electronically Signed   By: Garald Balding M.D.   On: 12/25/2015 21:28   Dg Shoulder Left  12/25/2015  CLINICAL DATA:  Pain.  No known injury EXAM: LEFT SHOULDER - 2+ VIEW COMPARISON:  None. FINDINGS: Frontal, Y scapular, and axillary images were obtained. There is no demonstrable fracture or dislocation. Joint spaces appear intact. No erosive  change. Visualized left lung clear. IMPRESSION: No demonstrable fracture or dislocation.  No apparent arthropathy. Electronically Signed   By: Lowella Grip III M.D.   On: 12/25/2015 20:48    Microbiology: No results found for this or any previous visit (from the past 240 hour(s)).   Labs: Basic Metabolic Panel:  Recent Labs Lab 12/25/15 1955  NA 139  K 4.0  CL 108  CO2 24  GLUCOSE 106*  BUN 10  CREATININE 0.74  CALCIUM 8.9   Liver Function Tests: No results for input(s): AST, ALT, ALKPHOS, BILITOT, PROT, ALBUMIN in the last 168 hours. No results for input(s): LIPASE, AMYLASE in the last 168 hours. No results for input(s): AMMONIA in the last 168 hours. CBC:  Recent Labs Lab 12/25/15 1955  WBC 5.5  NEUTROABS 3.2  HGB 13.5  HCT 40.0  MCV 86.6  PLT 195   Cardiac Enzymes:  Recent Labs Lab 12/25/15 1955 12/26/15 0026 12/26/15 0600 12/26/15 1158  TROPONINI <0.03 <0.03 <0.03 <0.03    BNP: BNP (last 3 results) No results for input(s): BNP in the last 8760 hours.  ProBNP (last 3 results) No results for input(s): PROBNP in the last 8760 hours.  CBG: No results for input(s): GLUCAP in the last 168 hours.     Signed:  Annita Brod  [facility] 12/26/2015, 3:13 PM

## 2016-02-14 ENCOUNTER — Ambulatory Visit (HOSPITAL_COMMUNITY): Payer: Self-pay | Admitting: Hematology & Oncology

## 2016-02-14 ENCOUNTER — Encounter (HOSPITAL_COMMUNITY): Payer: BLUE CROSS/BLUE SHIELD | Attending: Hematology & Oncology

## 2016-02-14 DIAGNOSIS — D693 Immune thrombocytopenic purpura: Secondary | ICD-10-CM | POA: Insufficient documentation

## 2016-02-14 NOTE — Progress Notes (Signed)
This encounter was created in error - please disregard.  This encounter was created in error - please disregard.

## 2016-07-06 ENCOUNTER — Encounter (HOSPITAL_COMMUNITY): Payer: 59 | Attending: Hematology & Oncology

## 2016-07-06 DIAGNOSIS — D693 Immune thrombocytopenic purpura: Secondary | ICD-10-CM | POA: Diagnosis present

## 2016-07-06 DIAGNOSIS — D869 Sarcoidosis, unspecified: Secondary | ICD-10-CM | POA: Insufficient documentation

## 2016-07-06 LAB — CBC WITH DIFFERENTIAL/PLATELET
Basophils Absolute: 0 10*3/uL (ref 0.0–0.1)
Basophils Relative: 0 %
Eosinophils Absolute: 0.1 10*3/uL (ref 0.0–0.7)
Eosinophils Relative: 1 %
HEMATOCRIT: 39.2 % (ref 36.0–46.0)
HEMOGLOBIN: 13.3 g/dL (ref 12.0–15.0)
LYMPHS ABS: 2.8 10*3/uL (ref 0.7–4.0)
Lymphocytes Relative: 30 %
MCH: 29.4 pg (ref 26.0–34.0)
MCHC: 33.9 g/dL (ref 30.0–36.0)
MCV: 86.7 fL (ref 78.0–100.0)
MONO ABS: 0.9 10*3/uL (ref 0.1–1.0)
MONOS PCT: 9 %
NEUTROS ABS: 5.7 10*3/uL (ref 1.7–7.7)
NEUTROS PCT: 60 %
Platelets: 221 10*3/uL (ref 150–400)
RBC: 4.52 MIL/uL (ref 3.87–5.11)
RDW: 12.5 % (ref 11.5–15.5)
WBC: 9.5 10*3/uL (ref 4.0–10.5)

## 2016-07-06 LAB — BASIC METABOLIC PANEL
Anion gap: 3 — ABNORMAL LOW (ref 5–15)
BUN: 17 mg/dL (ref 6–20)
CALCIUM: 8.8 mg/dL — AB (ref 8.9–10.3)
CHLORIDE: 105 mmol/L (ref 101–111)
CO2: 31 mmol/L (ref 22–32)
CREATININE: 0.79 mg/dL (ref 0.44–1.00)
GFR calc Af Amer: >60 mL/min (ref >60)
GFR calc non Af Amer: >60 mL/min (ref >60)
GLUCOSE: 84 mg/dL (ref 65–99)
Potassium: 3.6 mmol/L (ref 3.5–5.1)
Sodium: 139 mmol/L (ref 135–145)

## 2016-08-03 ENCOUNTER — Ambulatory Visit (HOSPITAL_COMMUNITY): Payer: BLUE CROSS/BLUE SHIELD | Admitting: Hematology & Oncology

## 2016-08-28 ENCOUNTER — Encounter (HOSPITAL_COMMUNITY): Payer: 59 | Attending: Hematology & Oncology | Admitting: Hematology & Oncology

## 2016-08-28 ENCOUNTER — Encounter (HOSPITAL_COMMUNITY): Payer: Self-pay | Admitting: Hematology & Oncology

## 2016-08-28 VITALS — BP 125/76 | HR 74 | Temp 98.4°F | Resp 16 | Wt 121.2 lb

## 2016-08-28 DIAGNOSIS — D693 Immune thrombocytopenic purpura: Secondary | ICD-10-CM

## 2016-08-28 DIAGNOSIS — D869 Sarcoidosis, unspecified: Secondary | ICD-10-CM | POA: Insufficient documentation

## 2016-08-28 NOTE — Patient Instructions (Signed)
[place] at [facility] Discharge Instructions  RECOMMENDATIONS MADE BY THE CONSULTANT AND ANY TEST RESULTS WILL BE SENT TO YOUR REFERRING PHYSICIAN.  You saw Dr. Whitney Muse today. Follow up in one year with labs. Labs every 6 months for 2 years.  Thank you for choosing [place] at [facility] to provide your oncology and hematology care.  To afford each patient quality time with our provider, please arrive at least 15 minutes before your scheduled appointment time.   Beginning January 23rd 2017 lab work for the [facility] will be done in the  Main lab at [facility] on 1st floor. If you have a lab appointment with the [facility] please come in thru the  Main Entrance and check in at the main information desk  You need to re-schedule your appointment should you arrive 10 or more minutes late.  We strive to give you quality time with our providers, and arriving late affects you and other patients whose appointments are after yours.  Also, if you no show three or more times for appointments you may be dismissed from the clinic at the providers discretion.     Again, thank you for choosing [facility].  Our hope is that these requests will decrease the amount of time that you wait before being seen by our physicians.       _____________________________________________________________  Should you have questions after your visit to [facility], please contact our office at [phone] between the hours of 8:30 a.m. and 4:30 p.m.  Voicemails left after 4:30 p.m. will not be returned until the following business day.  For prescription refill requests, have your pharmacy contact our office.         Resources For Cancer Patients and their Caregivers ? American Cancer Society: Can assist with transportation, wigs, general needs, runs Look Good Feel Better.        (979)272-1458 ? Cancer Care: Provides financial  assistance, online support groups, medication/co-pay assistance.  1-800-813-HOPE 423-569-5174) ? [facility] Assists [place] cancer patients and their families through emotional , educational and financial support.  609-753-1465 ? [place] DSS Where to apply for food stamps, Medicaid and utility assistance. (914)234-6235 ? [facility]: Transportation to medical appointments. 2034321243 ? Social Security Administration: May apply for disability if have a Stage IV cancer. (941) 231-5916 4241432083 ? [employer], Disability and Transit Services: Assists with nutrition, care and transit needs. [place] Support Programs: @10RELATIVEDAYS @ > Cancer Support Group  2nd Tuesday of the month 1pm-2pm, Journey Room  > Creative Journey  3rd Tuesday of the month 1130am-1pm, Journey Room  > Look Good Feel Better  1st Wednesday of the month 10am-12 noon, Journey Room (Call [facility] to register [phone])

## 2016-08-28 NOTE — Progress Notes (Signed)
Emma Kilts, MD [place] [address]  ITP  CURRENT THERAPY: Observation  INTERVAL HISTORY: Emma Smith 40 y.o. female returns for followup of ITP, chronic, stable. She says her blood work last month came back normal.  She was diagnosed with ITP when she was 40 years old and has had only a few episodes throughout her life: Her platelets dropped when she had a child with HELLP syndrome. Her platelets also dropped after she had the flu shot one year. Patient says that, although this may have been a coincidence, she still tries to avoid the flu shot. Emma Smith can sense when her platelets drop and keeps prednisone in the cabinet in case a situation were to arise.   As she gets older, she says she tends to bruise easier prior to menstrual cycle.   She has no family history of ITP.   Patient was diagnosed with sarcoidosis 4-5 years after being diagnosed with ITP. She was diagnosed by a lung specialist in [place].  Patient has had a mammogram.  She has no complaints today, no change in her baseline bleeding or bruising. No gum bleeding.     Past Medical History:  Diagnosis Date  . HELLP syndrome 06/26/2013   At end of first pregnancy  . Hypertension   . 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 Sarcoidosis ([place]) 06/26/2013   Documented by Dr. Arlyce Dice in 2004 by mediastinoscopy and bronchoscopy after an abnormal chest x-ray    has ITP (idiopathic thrombocytopenic purpura); HELLP syndrome in third trimester; Sarcoidosis ([place]); and Chest pain on her problem list.     has No Known Allergies.  Emma Smith does not currently have medications on file.  Past Surgical History:  Procedure Laterality Date  . BONE MARROW BIOPSY    . CESAREAN SECTION      Positive for bruising easily prior to menstrual cycle; denies any headaches, dizziness, double vision, fevers, chills, night sweats, nausea, vomiting, diarrhea,  constipation, chest pain, heart palpitations, shortness of breath, blood in stool, black tarry stool, urinary pain, urinary burning, urinary frequency, hematuria. 14 point review of systems was performed and is negative except as detailed under history of present illness and above  PHYSICAL EXAMINATION  ECOG PERFORMANCE STATUS: 0 - Asymptomatic  Vitals:   08/28/16 1033  BP: 125/76  Pulse: 74  Resp: 16  Temp: 98.4 F (36.9 C)    GENERAL:alert, no distress, well nourished, well developed, comfortable, cooperative and smiling SKIN: skin color, texture, turgor are normal, no rashes or significant lesions HEAD: Normocephalic, No masses, lesions, tenderness or abnormalities EYES: normal, PERRLA, EOMI, Conjunctiva are pink and non-injected EARS: External ears normal OROPHARYNX:lips, buccal mucosa, and tongue normal and mucous membranes are moist  NECK: supple, no adenopathy, thyroid normal size, non-tender, without nodularity, no stridor, non-tender, trachea midline LYMPH:  no palpable lymphadenopathy BREAST:not examined LUNGS: clear to auscultation and percussion HEART: regular rate & rhythm, no murmurs, no gallops, S1 normal and S2 normal ABDOMEN:not examined BACK: Back symmetric, no curvature., No CVA tenderness EXTREMITIES:less then 2 second capillary refill, no joint deformities, effusion, or inflammation, no edema, no skin discoloration, no clubbing, no cyanosis  NEURO: alert & oriented x 3 with fluent speech, no focal motor/sensory deficits, gait normal   LABORATORY DATA: CBC    Component Value Date/Time   WBC 9.5 07/06/2016 1205   RBC 4.52 07/06/2016 1205   HGB 13.3 07/06/2016 1205   HCT  39.2 07/06/2016 1205   PLT 221 07/06/2016 1205   MCV 86.7 07/06/2016 1205   MCH 29.4 07/06/2016 1205   MCHC 33.9 07/06/2016 1205   RDW 12.5 07/06/2016 1205   LYMPHSABS 2.8 07/06/2016 1205   MONOABS 0.9 07/06/2016 1205   EOSABS 0.1 07/06/2016 1205   BASOSABS 0.0 07/06/2016 1205       Chemistry      Component Value Date/Time   NA 139 07/06/2016 1205   K 3.6 07/06/2016 1205   CL 105 07/06/2016 1205   CO2 31 07/06/2016 1205   BUN 17 07/06/2016 1205   CREATININE 0.79 07/06/2016 1205      Component Value Date/Time   CALCIUM 8.8 (L) 07/06/2016 1205   ALKPHOS 99 03/02/2014 1051   AST 14 03/02/2014 1051   ALT 11 03/02/2014 1051   BILITOT 0.5 03/02/2014 1051       ASSESSMENT AND PLAN:  ITP Sarcoidosis Hx HELLP syndrome   THERAPY PLAN:   Patient appears to manage her condition well. She will continue with CBC's every 6 months. She also calls if she is worried about a fall incounts.  Will monitor labs.  Patient is to report any abnormal bruising, bleeding.   Follow up with patient in one year.   All questions were answered. The patient knows to call the clinic with any problems, questions or concerns. We can certainly see the patient much sooner if necessary.  This document serves as a record of services personally performed by Ancil Linsey, MD. It was created on her behalf by Elmyra Ricks, a trained medical scribe. The creation of this record is based on the scribe's personal observations and the provider's statements to them. This document has been checked and approved by the attending provider.  I have reviewed the above documentation for accuracy and completeness and I agree with the above.  This note is electronically signed PN:[id number] Cyril Mourning, MD  08/28/2016 10:38 AM

## 2016-08-29 ENCOUNTER — Encounter (HOSPITAL_COMMUNITY): Payer: Self-pay | Admitting: Hematology & Oncology

## 2016-10-12 ENCOUNTER — Other Ambulatory Visit: Payer: Self-pay | Admitting: Obstetrics & Gynecology

## 2016-10-12 DIAGNOSIS — Z1231 Encounter for screening mammogram for malignant neoplasm of breast: Secondary | ICD-10-CM

## 2016-11-29 ENCOUNTER — Ambulatory Visit: Payer: 59

## 2017-01-04 ENCOUNTER — Ambulatory Visit
Admission: RE | Admit: 2017-01-04 | Discharge: 2017-01-04 | Disposition: A | Discharge status: Home / Self Care | Payer: 59 | Source: Ambulatory Visit | Attending: Obstetrics & Gynecology | Admitting: Obstetrics & Gynecology

## 2017-01-04 DIAGNOSIS — Z1231 Encounter for screening mammogram for malignant neoplasm of breast: Secondary | ICD-10-CM | POA: Diagnosis not present

## 2017-01-18 DIAGNOSIS — Z1389 Encounter for screening for other disorder: Secondary | ICD-10-CM | POA: Diagnosis not present

## 2017-01-18 DIAGNOSIS — D6949 Other primary thrombocytopenia: Secondary | ICD-10-CM | POA: Diagnosis not present

## 2017-01-18 DIAGNOSIS — R002 Palpitations: Secondary | ICD-10-CM | POA: Diagnosis not present

## 2017-01-18 DIAGNOSIS — Z0001 Encounter for general adult medical examination with abnormal findings: Secondary | ICD-10-CM | POA: Diagnosis not present

## 2017-02-06 DIAGNOSIS — L819 Disorder of pigmentation, unspecified: Secondary | ICD-10-CM | POA: Diagnosis not present

## 2017-02-06 DIAGNOSIS — D2262 Melanocytic nevi of left upper limb, including shoulder: Secondary | ICD-10-CM | POA: Diagnosis not present

## 2017-02-06 DIAGNOSIS — D224 Melanocytic nevi of scalp and neck: Secondary | ICD-10-CM | POA: Diagnosis not present

## 2017-02-25 ENCOUNTER — Encounter (HOSPITAL_COMMUNITY): Payer: 59 | Attending: Oncology

## 2017-02-25 DIAGNOSIS — D693 Immune thrombocytopenic purpura: Secondary | ICD-10-CM

## 2017-02-25 LAB — COMPREHENSIVE METABOLIC PANEL
ALBUMIN: 3.4 g/dL — AB (ref 3.5–5.0)
ALK PHOS: 73 U/L (ref 38–126)
ALT: 18 U/L (ref 14–54)
ANION GAP: 5 (ref 5–15)
AST: 17 U/L (ref 15–41)
BUN: 14 mg/dL (ref 6–20)
CALCIUM: 8.4 mg/dL — AB (ref 8.9–10.3)
CO2: 30 mmol/L (ref 22–32)
Chloride: 102 mmol/L (ref 101–111)
Creatinine, Ser: 0.76 mg/dL (ref 0.44–1.00)
GFR calc Af Amer: >60 mL/min (ref >60)
GFR calc non Af Amer: >60 mL/min (ref >60)
GLUCOSE: 95 mg/dL (ref 65–99)
Potassium: 3.9 mmol/L (ref 3.5–5.1)
SODIUM: 137 mmol/L (ref 135–145)
Total Bilirubin: 0.3 mg/dL (ref 0.3–1.2)
Total Protein: 5.8 g/dL — ABNORMAL LOW (ref 6.5–8.1)

## 2017-02-25 LAB — CBC WITH DIFFERENTIAL/PLATELET
BASOS ABS: 0 10*3/uL (ref 0.0–0.1)
BASOS PCT: 1 %
EOS ABS: 0.1 10*3/uL (ref 0.0–0.7)
Eosinophils Relative: 2 %
HCT: 42.3 % (ref 36.0–46.0)
HEMOGLOBIN: 14.3 g/dL (ref 12.0–15.0)
Lymphocytes Relative: 26 %
Lymphs Abs: 1.4 10*3/uL (ref 0.7–4.0)
MCH: 29.7 pg (ref 26.0–34.0)
MCHC: 33.8 g/dL (ref 30.0–36.0)
MCV: 87.9 fL (ref 78.0–100.0)
Monocytes Absolute: 0.7 10*3/uL (ref 0.1–1.0)
Monocytes Relative: 13 %
NEUTROS PCT: 58 %
Neutro Abs: 3.1 10*3/uL (ref 1.7–7.7)
Platelets: 184 10*3/uL (ref 150–400)
RBC: 4.81 MIL/uL (ref 3.87–5.11)
RDW: 12.4 % (ref 11.5–15.5)
WBC: 5.3 10*3/uL (ref 4.0–10.5)

## 2017-02-27 ENCOUNTER — Encounter (HOSPITAL_COMMUNITY): Payer: Self-pay | Admitting: Adult Health

## 2017-02-27 NOTE — Progress Notes (Signed)
Patient with concerns re: recent labs collected on 02/25/17.  Her calcium is quite low. Corrected calcium 8.5. Her albumin and total protein are low. Would recommend she follow-up with her PCP and possible referral to endocrinology if this is not related to malnutrition.  From an ITP standpoint, her labs look excellent. CBC is normal.  Communicated to Lupita Raider, RN who will share with patient.    Mike Craze, NP [place] [phone]

## 2017-03-09 IMAGING — CT CT RENAL STONE PROTOCOL
3 of 4 series · 7 of 46 positions shown, 13 images · non-contrast
Comparison: None.

CLINICAL DATA: Left flank pain

EXAM:
CT ABDOMEN AND PELVIS WITHOUT CONTRAST
TECHNIQUE: Multidetector CT imaging of the abdomen and pelvis was performed
following the standard protocol without IV contrast.

[Series 3: lung 5.0 b60f · axial · 0.68mm/px · z∈[-59,-24]mm · 3 of 15 slices shown, 7 images]
[im 4/15  soft-tissue]
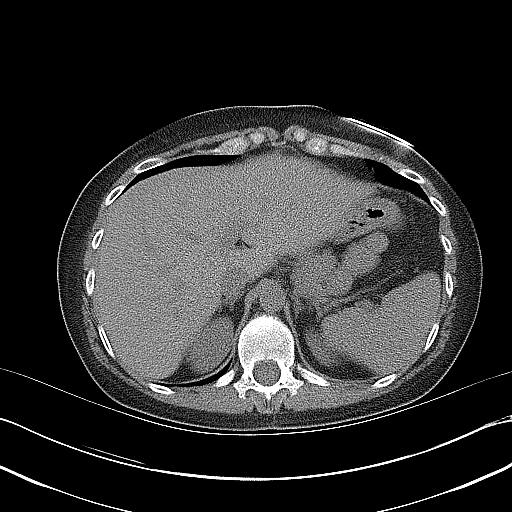
[im 4/15  lung]
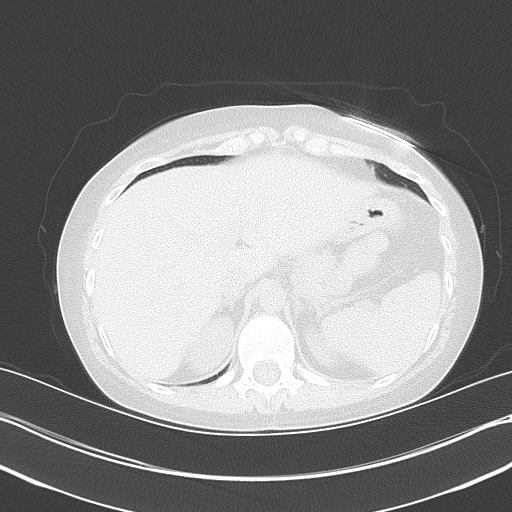
[im 4/15  bone]
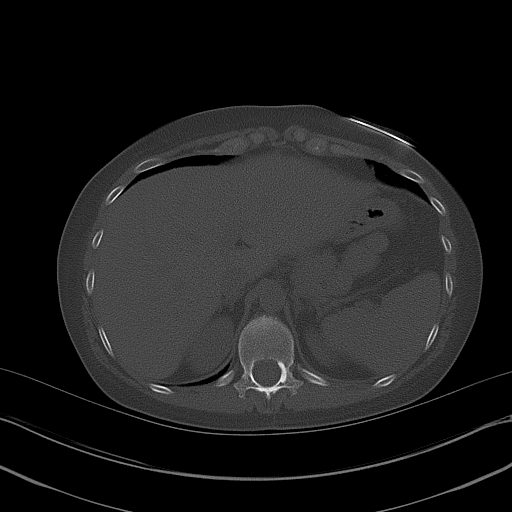
[im 8/15  soft-tissue]
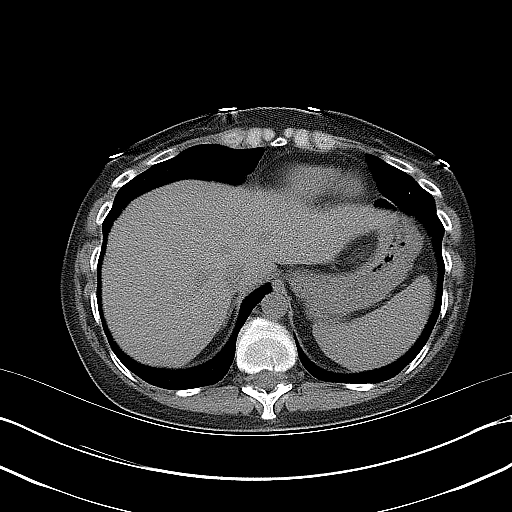
[im 8/15  lung]
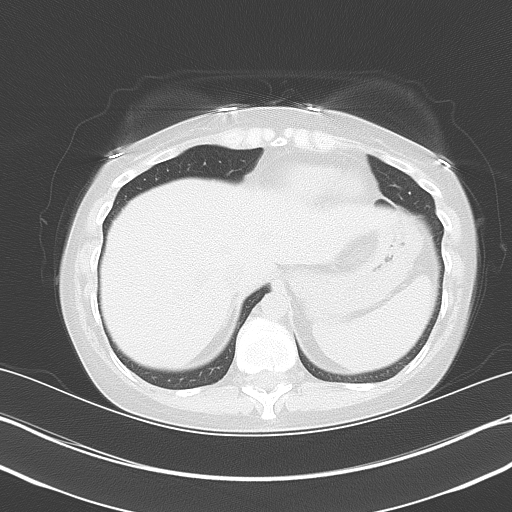
[im 11/15  soft-tissue]
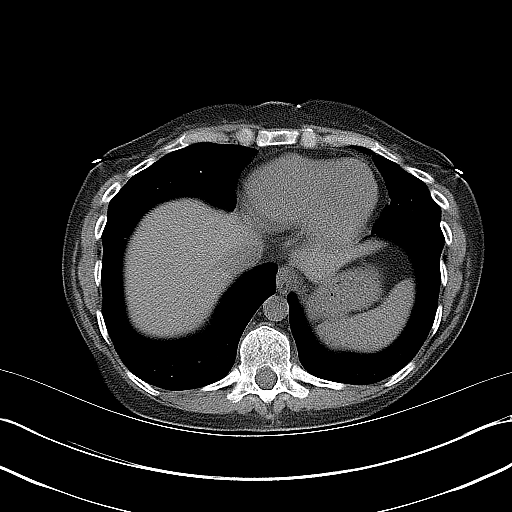
[im 11/15  lung]
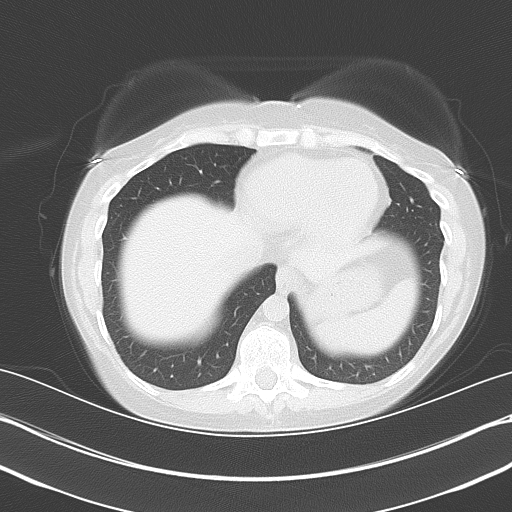

[Series 4: mpr coronal (id) · coronal · 0.66mm/px · 3 of 69 slices shown, 4 images]
[im 23/69  soft-tissue]
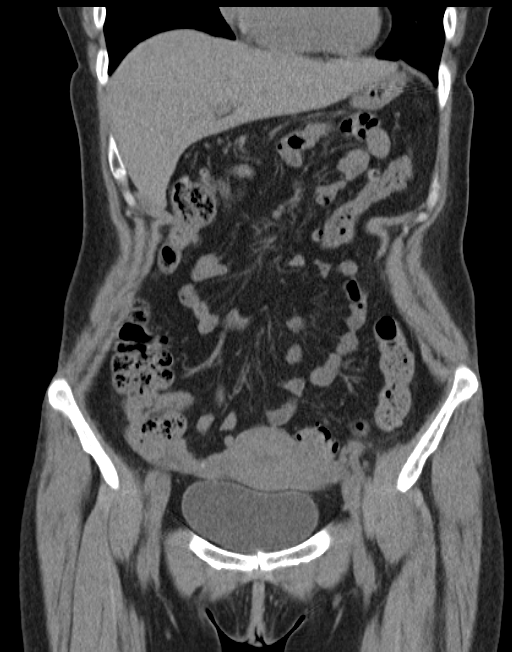
[im 31/69  soft-tissue]
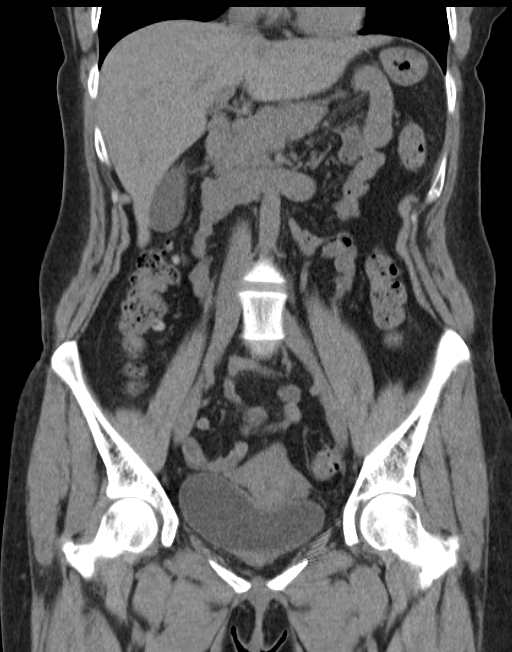
[im 31/69  bone]
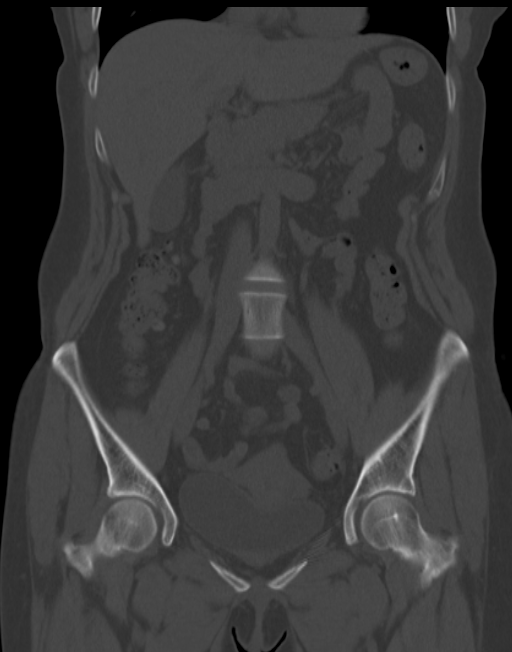
[im 38/69  soft-tissue]
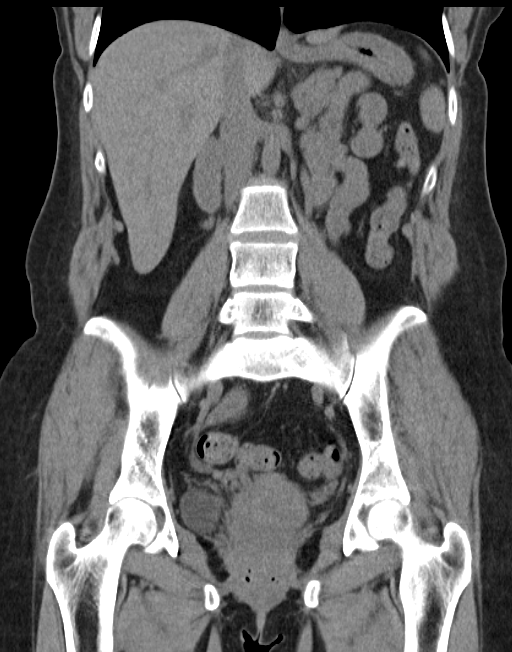

[Series 5: mpr sagittal (id) · sagittal · 0.46mm/px · 1 of 102 slices shown, 2 images]
[im 34/102  soft-tissue]
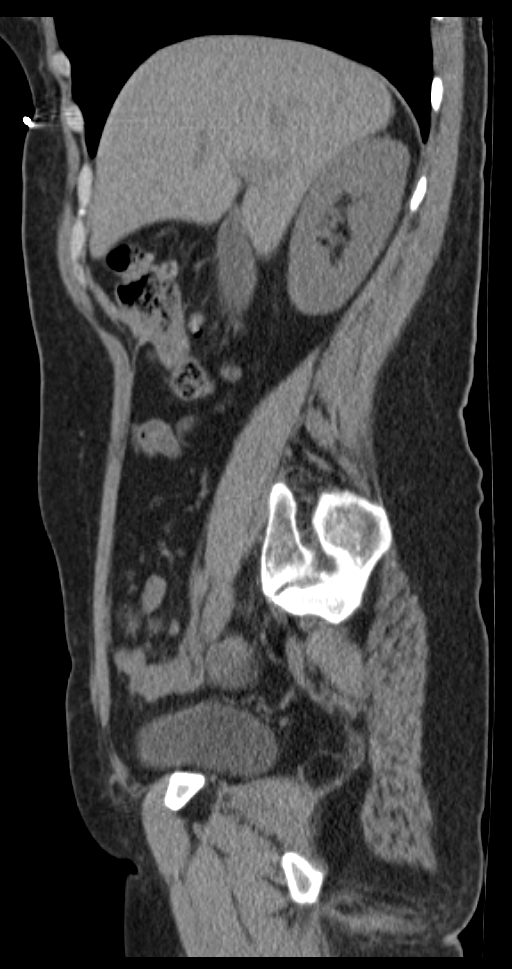
[im 34/102  bone]
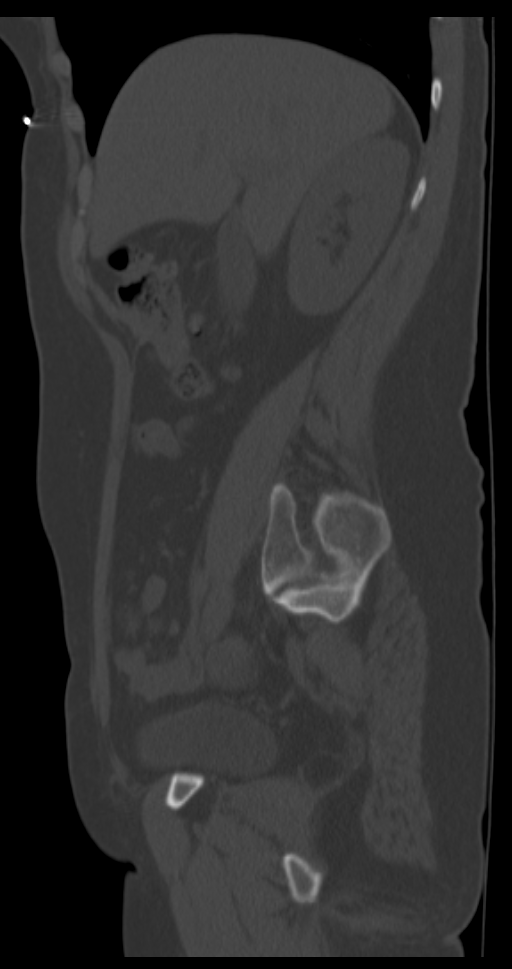

[7 of 46 positions shown; findings below may reference images not displayed]

FINDINGS: Lung bases are unremarkable. Sagittal images of the spine shows mild
degenerative changes lower thoracic spine. Unenhanced liver shows no
biliary ductal dilatation. Unenhanced pancreas, spleen and adrenal
glands are unremarkable. Abdominal aorta is unremarkable. No
calcified gallstones are noted within gallbladder. Unenhanced
kidneys are symmetrical in size. There is no nephrolithiasis. No
hydronephrosis or hydroureter. No calcified ureteral calculi are
noted.

Scattered colonic diverticula are noted. There is no evidence of
acute diverticulitis. Some stool noted within cecum. Moderate stool
noted within sigmoid colon. The unenhanced uterus is normal size.
The uterus is anteflexed. No adnexal mass.

No destructive bony lesions are noted within pelvis. No inguinal
adenopathy.

There is no pericecal inflammation. Normal appendix is noted in
axial image 56. No calcified calculi are noted within urinary
bladder.
IMPRESSION: 1. There is no nephrolithiasis.  No hydronephrosis or hydroureter.
2. No calcified ureteral calculi are noted.
3. Normal appendix.  No pericecal inflammation.
4. Scattered colonic diverticula. No evidence of acute
diverticulitis. Moderate stool noted within sigmoid colon.

## 2017-03-20 DIAGNOSIS — R7989 Other specified abnormal findings of blood chemistry: Secondary | ICD-10-CM | POA: Diagnosis not present

## 2017-03-20 DIAGNOSIS — Z1389 Encounter for screening for other disorder: Secondary | ICD-10-CM | POA: Diagnosis not present

## 2017-03-20 DIAGNOSIS — Z6821 Body mass index (BMI) 21.0-21.9, adult: Secondary | ICD-10-CM | POA: Diagnosis not present

## 2017-03-20 DIAGNOSIS — E8809 Other disorders of plasma-protein metabolism, not elsewhere classified: Secondary | ICD-10-CM | POA: Diagnosis not present

## 2017-08-27 ENCOUNTER — Encounter (HOSPITAL_COMMUNITY): Payer: 59 | Attending: Oncology

## 2017-08-27 DIAGNOSIS — D693 Immune thrombocytopenic purpura: Secondary | ICD-10-CM | POA: Insufficient documentation

## 2017-08-27 LAB — COMPREHENSIVE METABOLIC PANEL
ALT: 24 U/L (ref 14–54)
ANION GAP: 7 (ref 5–15)
AST: 17 U/L (ref 15–41)
Albumin: 3.6 g/dL (ref 3.5–5.0)
Alkaline Phosphatase: 72 U/L (ref 38–126)
BILIRUBIN TOTAL: 0.6 mg/dL (ref 0.3–1.2)
BUN: 14 mg/dL (ref 6–20)
CHLORIDE: 102 mmol/L (ref 101–111)
CO2: 28 mmol/L (ref 22–32)
Calcium: 8.3 mg/dL — ABNORMAL LOW (ref 8.9–10.3)
Creatinine, Ser: 0.72 mg/dL (ref 0.44–1.00)
Glucose, Bld: 93 mg/dL (ref 65–99)
POTASSIUM: 3.3 mmol/L — AB (ref 3.5–5.1)
Sodium: 137 mmol/L (ref 135–145)
Total Protein: 5.9 g/dL — ABNORMAL LOW (ref 6.5–8.1)

## 2017-08-27 LAB — CBC WITH DIFFERENTIAL/PLATELET
BASOS ABS: 0 10*3/uL (ref 0.0–0.1)
Basophils Relative: 0 %
EOS PCT: 1 %
Eosinophils Absolute: 0.1 10*3/uL (ref 0.0–0.7)
HEMATOCRIT: 38.4 % (ref 36.0–46.0)
Hemoglobin: 13.2 g/dL (ref 12.0–15.0)
LYMPHS PCT: 30 %
Lymphs Abs: 1.7 10*3/uL (ref 0.7–4.0)
MCH: 29.8 pg (ref 26.0–34.0)
MCHC: 34.4 g/dL (ref 30.0–36.0)
MCV: 86.7 fL (ref 78.0–100.0)
MONOS PCT: 12 %
Monocytes Absolute: 0.7 10*3/uL (ref 0.1–1.0)
NEUTROS ABS: 3 10*3/uL (ref 1.7–7.7)
Neutrophils Relative %: 57 %
PLATELETS: 182 10*3/uL (ref 150–400)
RBC: 4.43 MIL/uL (ref 3.87–5.11)
RDW: 12.6 % (ref 11.5–15.5)
WBC: 5.4 10*3/uL (ref 4.0–10.5)

## 2017-08-29 ENCOUNTER — Encounter (HOSPITAL_COMMUNITY): Payer: Self-pay | Admitting: *Deleted

## 2017-08-29 ENCOUNTER — Other Ambulatory Visit (HOSPITAL_COMMUNITY): Payer: 59

## 2017-08-29 ENCOUNTER — Ambulatory Visit (HOSPITAL_COMMUNITY): Payer: 59

## 2017-09-09 DIAGNOSIS — Z1389 Encounter for screening for other disorder: Secondary | ICD-10-CM | POA: Diagnosis not present

## 2017-09-09 DIAGNOSIS — D869 Sarcoidosis, unspecified: Secondary | ICD-10-CM | POA: Diagnosis not present

## 2017-09-09 DIAGNOSIS — E8809 Other disorders of plasma-protein metabolism, not elsewhere classified: Secondary | ICD-10-CM | POA: Diagnosis not present

## 2017-09-09 DIAGNOSIS — D6949 Other primary thrombocytopenia: Secondary | ICD-10-CM | POA: Diagnosis not present

## 2017-09-10 ENCOUNTER — Encounter (HOSPITAL_COMMUNITY): Payer: Self-pay | Admitting: Oncology

## 2017-09-10 ENCOUNTER — Encounter (HOSPITAL_COMMUNITY): Payer: 59 | Attending: Oncology | Admitting: Oncology

## 2017-09-10 VITALS — BP 121/72 | HR 70 | Temp 97.9°F | Resp 18 | Wt 136.3 lb

## 2017-09-10 DIAGNOSIS — D693 Immune thrombocytopenic purpura: Secondary | ICD-10-CM | POA: Diagnosis not present

## 2017-09-10 NOTE — Patient Instructions (Signed)
[facility] at [facility] Discharge Instructions  RECOMMENDATIONS MADE BY THE CONSULTANT AND ANY TEST RESULTS WILL BE SENT TO YOUR REFERRING PHYSICIAN.  You saw Dr. Zhou today.  Thank you for choosing [facility] at [facility] to provide your oncology and hematology care.  To afford each patient quality time with our provider, please arrive at least 15 minutes before your scheduled appointment time.    If you have a lab appointment with the Cancer Center please come in thru the  Main Entrance and check in at the main information desk  You need to re-schedule your appointment should you arrive 10 or more minutes late.  We strive to give you quality time with our providers, and arriving late affects you and other patients whose appointments are after yours.  Also, if you no show three or more times for appointments you may be dismissed from the clinic at the providers discretion.     Again, thank you for choosing [facility].  Our hope is that these requests will decrease the amount of time that you wait before being seen by our physicians.       _____________________________________________________________  Should you have questions after your visit to [facility], please contact our office at [phone] between the hours of 8:30 a.m. and 4:30 p.m.  Voicemails left after 4:30 p.m. will not be returned until the following business day.  For prescription refill requests, have your pharmacy contact our office.       Resources For Cancer Patients and their Caregivers ? American Cancer Society: Can assist with transportation, wigs, general needs, runs Look Good Feel Better.        1-888-227-6333 ? Cancer Care: Provides financial assistance, online support groups, medication/co-pay assistance.  1-800-813-HOPE (4673) ? [facility] Assists [place] cancer patients and their families through  emotional , educational and financial support.  336-427-4357 ? [place] DSS Where to apply for food stamps, Medicaid and utility assistance. 336-342-1394 ? [facility]: Transportation to medical appointments. 336-347-2287 ? Social Security Administration: May apply for disability if have a Stage IV cancer. 336-342-7796 1-800-772-1213 ? [place] Aging, Disability and Transit Services: Assists with nutrition, care and transit needs. 336-349-2343  Cancer Center Support Programs: @10RELATIVEDAYS@ > Cancer Support Group  2nd Tuesday of the month 1pm-2pm, Journey Room  > Creative Journey  3rd Tuesday of the month 1130am-1pm, Journey Room  > Look Good Feel Better  1st Wednesday of the month 10am-12 noon, Journey Room (Call American Cancer Society to register 1-800-395-5775)    

## 2017-09-10 NOTE — Progress Notes (Signed)
Patient states she will get her flu shot at her place of employment.

## 2017-09-10 NOTE — Progress Notes (Signed)
Emma Sites, MD [place] [address]  ITP  CURRENT THERAPY: Observation  INTERVAL HISTORY: Emma Smith 41 y.o. female returns for followup of ITP, chronic, stable. She says her blood work last month came back normal.  She was diagnosed with ITP when she was 41 years old and has had only a few episodes throughout her life: Her platelets dropped when she had a child with HELLP syndrome. Her platelets also dropped after she had the flu shot one year. Patient says that, although this may have been a coincidence, she still tries to avoid the flu shot. [place] can sense when her platelets drop and keeps prednisone in the cabinet in case a situation were to arise.   She presents today for continued follow-up of her ITP. Her most recent bloodwork from 08/27/2017 demonstrated her platelet count was 182K. She denies any evidence of bleeding. She states she normally bruises easily. She denies any petechiae. Patient states that overall she feels well and she has no complaints.  Past Medical History:  Diagnosis Date  . HELLP syndrome 06/26/2013   At end of first pregnancy  . Hypertension   . 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 Sarcoidosis 06/26/2013   Documented by Dr. Arlyce Dice in 2004 by mediastinoscopy and bronchoscopy after an abnormal chest x-ray    has Idiopathic thrombocytopenic purpura ([place]); HELLP syndrome in third trimester; Sarcoidosis ([place]); and Chest pain on her problem list.     has No Known Allergies.  Ms. Cork had no medications administered during this visit.  Past Surgical History:  Procedure Laterality Date  . BONE MARROW BIOPSY    . CESAREAN SECTION      Positive for bruising easily prior to menstrual cycle; denies any headaches, dizziness, double vision, fevers, chills, night sweats, nausea, vomiting, diarrhea, constipation, chest pain, heart palpitations, shortness of breath, blood in stool, black tarry  stool, urinary pain, urinary burning, urinary frequency, hematuria. 14 point review of systems was performed and is negative except as detailed under history of present illness and above  PHYSICAL EXAMINATION  ECOG PERFORMANCE STATUS: 0 - Asymptomatic  Vitals:   09/10/17 1039  BP: 121/72  Pulse: 70  Resp: 18  Temp: 97.9 F (36.6 C)  SpO2: 99%    Constitutional: Well-developed, well-nourished, and in no distress.   HENT:  Head: Normocephalic and atraumatic.  Mouth/Throat: No oropharyngeal exudate. Mucosa moist. Eyes: Pupils are equal, round, and reactive to light. Conjunctivae are normal. No scleral icterus.  Neck: Normal range of motion. Neck supple. No JVD present.  Cardiovascular: Normal rate, regular rhythm and normal heart sounds.  Exam reveals no gallop and no friction rub.   No murmur heard. Pulmonary/Chest: Effort normal and breath sounds normal. No respiratory distress. No wheezes.No rales.  Abdominal: Soft. Bowel sounds are normal. No distension. There is no tenderness. There is no guarding.  Musculoskeletal: No edema or tenderness.  Lymphadenopathy:    No cervical or supraclavicular adenopathy.  Neurological: Alert and oriented to person, place, and time. No cranial nerve deficit.  Skin: Skin is warm and dry. No rash noted. No erythema. No pallor. No petechiae. Psychiatric: Affect and judgment normal.     LABORATORY DATA: CBC    Component Value Date/Time   WBC 5.4 08/27/2017 1024   RBC 4.43 08/27/2017 1024   HGB 13.2 08/27/2017 1024   HCT 38.4 08/27/2017 1024   PLT 182 08/27/2017 1024   MCV 86.7  08/27/2017 1024   MCH 29.8 08/27/2017 1024   MCHC 34.4 08/27/2017 1024   RDW 12.6 08/27/2017 1024   LYMPHSABS 1.7 08/27/2017 1024   MONOABS 0.7 08/27/2017 1024   EOSABS 0.1 08/27/2017 1024   BASOSABS 0.0 08/27/2017 1024      Chemistry      Component Value Date/Time   NA 137 08/27/2017 1024   K 3.3 (L) 08/27/2017 1024   CL 102 08/27/2017 1024   CO2 28  08/27/2017 1024   BUN 14 08/27/2017 1024   CREATININE 0.72 08/27/2017 1024      Component Value Date/Time   CALCIUM 8.3 (L) 08/27/2017 1024   ALKPHOS 72 08/27/2017 1024   AST 17 08/27/2017 1024   ALT 24 08/27/2017 1024   BILITOT 0.6 08/27/2017 1024       ASSESSMENT AND PLAN:  ITP Sarcoidosis Hx HELLP syndrome   PLAN:   Platelets have remained in the normal range without any relapse of her ITP recently. She will continue with CBC's every 6 months.  I have advised patient to come see [place] sooner should she notice any signs of major bleeding/bruising at home. Follow up in one year.   All questions were answered. The patient knows to call the clinic with any problems, questions or concerns. We can certainly see the patient much sooner if necessary.   This note is electronically signed [id number] Talbert Cage, MD  09/10/2017 10:45 AM

## 2017-10-30 DIAGNOSIS — J029 Acute pharyngitis, unspecified: Secondary | ICD-10-CM | POA: Diagnosis not present

## 2017-10-30 DIAGNOSIS — R0981 Nasal congestion: Secondary | ICD-10-CM | POA: Diagnosis not present

## 2017-10-30 DIAGNOSIS — K5792 Diverticulitis of intestine, part unspecified, without perforation or abscess without bleeding: Secondary | ICD-10-CM | POA: Diagnosis not present

## 2017-12-11 ENCOUNTER — Other Ambulatory Visit: Payer: Self-pay | Admitting: Obstetrics & Gynecology

## 2017-12-11 ENCOUNTER — Other Ambulatory Visit: Payer: Self-pay | Admitting: Obstetrics

## 2017-12-11 DIAGNOSIS — Z1231 Encounter for screening mammogram for malignant neoplasm of breast: Secondary | ICD-10-CM

## 2017-12-25 DIAGNOSIS — B373 Candidiasis of vulva and vagina: Secondary | ICD-10-CM | POA: Diagnosis not present

## 2017-12-25 DIAGNOSIS — Z01419 Encounter for gynecological examination (general) (routine) without abnormal findings: Secondary | ICD-10-CM | POA: Diagnosis not present

## 2017-12-25 DIAGNOSIS — N898 Other specified noninflammatory disorders of vagina: Secondary | ICD-10-CM | POA: Diagnosis not present

## 2017-12-25 DIAGNOSIS — Z6821 Body mass index (BMI) 21.0-21.9, adult: Secondary | ICD-10-CM | POA: Diagnosis not present

## 2018-01-08 ENCOUNTER — Ambulatory Visit
Admission: RE | Admit: 2018-01-08 | Discharge: 2018-01-08 | Disposition: A | Discharge status: Home / Self Care | Payer: 59 | Source: Ambulatory Visit | Attending: Obstetrics | Admitting: Obstetrics

## 2018-01-08 DIAGNOSIS — Z1231 Encounter for screening mammogram for malignant neoplasm of breast: Secondary | ICD-10-CM

## 2018-01-27 DIAGNOSIS — D869 Sarcoidosis, unspecified: Secondary | ICD-10-CM | POA: Diagnosis not present

## 2018-01-28 DIAGNOSIS — Z1389 Encounter for screening for other disorder: Secondary | ICD-10-CM | POA: Diagnosis not present

## 2018-01-28 DIAGNOSIS — Z0000 Encounter for general adult medical examination without abnormal findings: Secondary | ICD-10-CM | POA: Diagnosis not present

## 2018-01-28 DIAGNOSIS — D6949 Other primary thrombocytopenia: Secondary | ICD-10-CM | POA: Diagnosis not present

## 2018-03-10 ENCOUNTER — Other Ambulatory Visit (HOSPITAL_COMMUNITY): Payer: Self-pay | Admitting: *Deleted

## 2018-03-10 DIAGNOSIS — D693 Immune thrombocytopenic purpura: Secondary | ICD-10-CM

## 2018-03-11 ENCOUNTER — Other Ambulatory Visit (HOSPITAL_COMMUNITY): Payer: 59

## 2018-08-01 DIAGNOSIS — L255 Unspecified contact dermatitis due to plants, except food: Secondary | ICD-10-CM | POA: Diagnosis not present

## 2018-08-01 DIAGNOSIS — Z6822 Body mass index (BMI) 22.0-22.9, adult: Secondary | ICD-10-CM | POA: Diagnosis not present

## 2018-09-03 ENCOUNTER — Other Ambulatory Visit (HOSPITAL_COMMUNITY): Payer: 59

## 2018-09-08 DIAGNOSIS — E663 Overweight: Secondary | ICD-10-CM | POA: Diagnosis not present

## 2018-09-10 ENCOUNTER — Ambulatory Visit (HOSPITAL_COMMUNITY): Payer: 59 | Admitting: Hematology

## 2018-09-10 ENCOUNTER — Other Ambulatory Visit (HOSPITAL_COMMUNITY): Payer: 59

## 2018-10-02 DIAGNOSIS — D2262 Melanocytic nevi of left upper limb, including shoulder: Secondary | ICD-10-CM | POA: Diagnosis not present

## 2018-10-02 DIAGNOSIS — D2271 Melanocytic nevi of right lower limb, including hip: Secondary | ICD-10-CM | POA: Diagnosis not present

## 2018-10-02 DIAGNOSIS — D225 Melanocytic nevi of trunk: Secondary | ICD-10-CM | POA: Diagnosis not present

## 2018-10-07 ENCOUNTER — Inpatient Hospital Stay (HOSPITAL_COMMUNITY): Payer: 59 | Attending: Hematology

## 2018-10-07 DIAGNOSIS — D693 Immune thrombocytopenic purpura: Secondary | ICD-10-CM | POA: Diagnosis not present

## 2018-10-07 LAB — CBC WITH DIFFERENTIAL/PLATELET
Abs Immature Granulocytes: 0.02 10*3/uL (ref 0.00–0.07)
BASOS PCT: 1 %
Basophils Absolute: 0.1 10*3/uL (ref 0.0–0.1)
EOS ABS: 0.1 10*3/uL (ref 0.0–0.5)
Eosinophils Relative: 1 %
HCT: 42.3 % (ref 36.0–46.0)
Hemoglobin: 13.9 g/dL (ref 12.0–15.0)
Immature Granulocytes: 0 %
Lymphocytes Relative: 25 %
Lymphs Abs: 1.5 10*3/uL (ref 0.7–4.0)
MCH: 29.3 pg (ref 26.0–34.0)
MCHC: 32.9 g/dL (ref 30.0–36.0)
MCV: 89.1 fL (ref 80.0–100.0)
MONO ABS: 0.5 10*3/uL (ref 0.1–1.0)
MONOS PCT: 9 %
NEUTROS PCT: 64 %
Neutro Abs: 3.8 10*3/uL (ref 1.7–7.7)
PLATELETS: 219 10*3/uL (ref 150–400)
RBC: 4.75 MIL/uL (ref 3.87–5.11)
RDW: 12.5 % (ref 11.5–15.5)
WBC: 5.8 10*3/uL (ref 4.0–10.5)
nRBC: 0 % (ref 0.0–0.2)

## 2018-10-07 LAB — COMPREHENSIVE METABOLIC PANEL
ALT: 14 U/L (ref 0–44)
ANION GAP: 6 (ref 5–15)
AST: 13 U/L — ABNORMAL LOW (ref 15–41)
Albumin: 4 g/dL (ref 3.5–5.0)
Alkaline Phosphatase: 77 U/L (ref 38–126)
BUN: 13 mg/dL (ref 6–20)
CHLORIDE: 106 mmol/L (ref 98–111)
CO2: 25 mmol/L (ref 22–32)
Calcium: 8.5 mg/dL — ABNORMAL LOW (ref 8.9–10.3)
Creatinine, Ser: 0.75 mg/dL (ref 0.44–1.00)
Glucose, Bld: 90 mg/dL (ref 70–99)
POTASSIUM: 3.6 mmol/L (ref 3.5–5.1)
Sodium: 137 mmol/L (ref 135–145)
Total Bilirubin: 0.8 mg/dL (ref 0.3–1.2)
Total Protein: 6.6 g/dL (ref 6.5–8.1)

## 2018-10-16 ENCOUNTER — Encounter (HOSPITAL_COMMUNITY): Payer: Self-pay | Admitting: Internal Medicine

## 2018-10-16 ENCOUNTER — Inpatient Hospital Stay (HOSPITAL_COMMUNITY): Payer: 59 | Attending: Hematology | Admitting: Internal Medicine

## 2018-10-16 VITALS — BP 131/86 | HR 60 | Temp 97.7°F | Resp 18 | Wt 134.0 lb

## 2018-10-16 DIAGNOSIS — D869 Sarcoidosis, unspecified: Secondary | ICD-10-CM | POA: Insufficient documentation

## 2018-10-16 DIAGNOSIS — D693 Immune thrombocytopenic purpura: Secondary | ICD-10-CM | POA: Diagnosis present

## 2018-10-16 DIAGNOSIS — Z79899 Other long term (current) drug therapy: Secondary | ICD-10-CM | POA: Diagnosis not present

## 2018-10-16 DIAGNOSIS — I10 Essential (primary) hypertension: Secondary | ICD-10-CM | POA: Diagnosis not present

## 2018-10-16 NOTE — Patient Instructions (Signed)
[facility] at [facility]  Discharge Instructions:   _______________________________________________________________  Thank you for choosing [facility] at [facility] to provide your oncology and hematology care.  To afford each patient quality time with our providers, please arrive at least 15 minutes before your scheduled appointment.  You need to re-schedule your appointment if you arrive 10 or more minutes late.  We strive to give you quality time with our providers, and arriving late affects you and other patients whose appointments are after yours.  Also, if you no show three or more times for appointments you may be dismissed from the clinic.  Again, thank you for choosing [facility] at [facility]. Our hope is that these requests will allow you access to exceptional care and in a timely manner. _______________________________________________________________  If you have questions after your visit, please contact our office at [phone] between the hours of 8:30 a.m. and 5:00 p.m. Voicemails left after 4:30 p.m. will not be returned until the following business day. _______________________________________________________________  For prescription refill requests, have your pharmacy contact our office. _______________________________________________________________  Recommendations made by the consultant and any test results will be sent to your referring physician. _______________________________________________________________ 

## 2018-10-16 NOTE — Progress Notes (Signed)
Diagnosis Idiopathic thrombocytopenia purpura (HCC) - Plan: CBC with Differential/Platelet, Comprehensive metabolic panel, Lactate dehydrogenase  Staging Cancer Staging No matching staging information was found for the patient.  Assessment and Plan: 1.  ITP.  Labs done 10/07/2018 reviewed and showed WBC 5.8 HB 13.9 plts 219,000.  Chemistries WNL with K+ 3.6 Cr 0.75 and normal LFTs.  Pt was requesting Rx for steroids.  I discussed with her plt count has remained Stable going back to 2013.  Rx for steroids not currently indicated due to the stability of her counts.  She is advised to be seen in ER or doctor's office if any bruising or bleeding occurs for evaluation.  Pt will continue to have labs done with PCP in 6 months and will RTC in 1 year for follow-up and repeat labs.    2.  Sarcoidosis.  Pt should follow-up with PCP or pulmonary as directed.    3.  Hx HELLP syndrome.  Labs done 10/07/2018 show plt count of 219,000 with normal LFTs.  She will have repeat labs in 1 year.     Current Status:  Pt is seen today for follow-up.  She is here to go over labs.  She is requesting Rx for Prednisone "just in case counts drop."   Problem List Patient Active Problem List   Diagnosis Date Noted  . Chest pain [R07.9] 12/25/2015  . Idiopathic thrombocytopenic purpura ([place]) [D69.3] 06/26/2013  . HELLP syndrome in third trimester [O14.23] 06/26/2013  . Sarcoidosis ([facility]) [D86.9] 06/26/2013    Past Medical History Past Medical History:  Diagnosis Date  . HELLP syndrome 06/26/2013   At end of first pregnancy  . Hypertension   . 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 Sarcoidosis 06/26/2013   Documented by Dr. Arlyce Dice in 2004 by mediastinoscopy and bronchoscopy after an abnormal chest x-ray    Past Surgical History Past Surgical History:  Procedure Laterality Date  . BONE MARROW BIOPSY    . CESAREAN SECTION      Family History Family History  Problem  Relation Age of Onset  . Hypertension Father      Social History  reports that she has never smoked. She has never used smokeless tobacco. She reports that she does not drink alcohol or use drugs.  Medications  Current Outpatient Medications:  .  acebutolol (SECTRAL) 200 MG capsule, Take 200 mg by mouth daily., Disp: , Rfl:  .  ALPRAZolam (XANAX) 0.5 MG tablet, Take 0.5 mg by mouth as needed for anxiety., Disp: , Rfl:  .  Calcium Carb-Cholecalciferol (CALCIUM 600+D) 600-800 MG-UNIT TABS, Take 1 tablet by mouth 2 (two) times daily., Disp: , Rfl:  .  Cetirizine-Pseudoephedrine (ZYRTEC-D PO), Take 1 tablet by mouth daily as needed (Allergies). , Disp: , Rfl:  .  ibuprofen (ADVIL,MOTRIN) 200 MG tablet, Take 400 mg by mouth every 6 (six) hours as needed for mild pain., Disp: , Rfl:  .  omeprazole (PRILOSEC) 20 MG capsule, , Disp: , Rfl: 1  Allergies Patient has no known allergies.  Review of Systems Review of Systems - Oncology ROS negative   Physical Exam  Vitals Wt Readings from Last 3 Encounters:  10/16/18 134 lb (60.8 kg)  09/10/17 136 lb 4.8 oz (61.8 kg)  08/28/16 121 lb 3.2 oz (55 kg)   Temp Readings from Last 3 Encounters:  10/16/18 97.7 F (36.5 C) (Oral)  09/10/17 97.9 F (36.6 C) (Oral)  08/28/16 98.4 F (36.9 C) (Oral)   BP Readings  from Last 3 Encounters:  10/16/18 131/86  09/10/17 121/72  08/28/16 125/76   Pulse Readings from Last 3 Encounters:  10/16/18 60  09/10/17 70  08/28/16 74   Constitutional: Well-developed, well-nourished, and in no distress.   HENT: Head: Normocephalic and atraumatic.  Mouth/Throat: No oropharyngeal exudate. Mucosa moist. Eyes: Pupils are equal, round, and reactive to light. Conjunctivae are normal. No scleral icterus.  Neck: Normal range of motion. Neck supple. No JVD present.  Cardiovascular: Normal rate, regular rhythm and normal heart sounds.  Exam reveals no gallop and no friction rub.   No murmur  heard. Pulmonary/Chest: Effort normal and breath sounds normal. No respiratory distress. No wheezes.No rales.  Abdominal: Soft. Bowel sounds are normal. No distension. There is no tenderness. There is no guarding.  Musculoskeletal: No edema or tenderness.  Lymphadenopathy: No cervical, axillary or supraclavicular adenopathy.  Neurological: Alert and oriented to person, place, and time. No cranial nerve deficit.  Skin: Skin is warm and dry. No rash noted. No erythema. No pallor.  Psychiatric: Affect and judgment normal.   Labs No visits with results within 3 Day(s) from this visit.  Latest known visit with results is:  Appointment on 10/07/2018  Component Date Value Ref Range Status  . WBC 10/07/2018 5.8  4.0 - 10.5 K/uL Final  . RBC 10/07/2018 4.75  3.87 - 5.11 MIL/uL Final  . Hemoglobin 10/07/2018 13.9  12.0 - 15.0 g/dL Final  . HCT 10/07/2018 42.3  36.0 - 46.0 % Final  . MCV 10/07/2018 89.1  80.0 - 100.0 fL Final  . MCH 10/07/2018 29.3  26.0 - 34.0 pg Final  . MCHC 10/07/2018 32.9  30.0 - 36.0 g/dL Final  . RDW 10/07/2018 12.5  11.5 - 15.5 % Final  . Platelets 10/07/2018 219  150 - 400 K/uL Final  . nRBC 10/07/2018 0.0  0.0 - 0.2 % Final  . Neutrophils Relative % 10/07/2018 64  % Final  . Neutro Abs 10/07/2018 3.8  1.7 - 7.7 K/uL Final  . Lymphocytes Relative 10/07/2018 25  % Final  . Lymphs Abs 10/07/2018 1.5  0.7 - 4.0 K/uL Final  . Monocytes Relative 10/07/2018 9  % Final  . Monocytes Absolute 10/07/2018 0.5  0.1 - 1.0 K/uL Final  . Eosinophils Relative 10/07/2018 1  % Final  . Eosinophils Absolute 10/07/2018 0.1  0.0 - 0.5 K/uL Final  . Basophils Relative 10/07/2018 1  % Final  . Basophils Absolute 10/07/2018 0.1  0.0 - 0.1 K/uL Final  . Immature Granulocytes 10/07/2018 0  % Final  . Abs Immature Granulocytes 10/07/2018 0.02  0.00 - 0.07 K/uL Final   Performed at [facility], [address], [place], [place] [address]  . Sodium 10/07/2018 137  135 - 145 mmol/L Final  .  Potassium 10/07/2018 3.6  3.5 - 5.1 mmol/L Final  . Chloride 10/07/2018 106  98 - 111 mmol/L Final  . CO2 10/07/2018 25  22 - 32 mmol/L Final  . Glucose, Bld 10/07/2018 90  70 - 99 mg/dL Final  . BUN 10/07/2018 13  6 - 20 mg/dL Final  . Creatinine, Ser 10/07/2018 0.75  0.44 - 1.00 mg/dL Final  . Calcium 10/07/2018 8.5* 8.9 - 10.3 mg/dL Final  . Total Protein 10/07/2018 6.6  6.5 - 8.1 g/dL Final  . Albumin 10/07/2018 4.0  3.5 - 5.0 g/dL Final  . AST 10/07/2018 13* 15 - 41 U/L Final  . ALT 10/07/2018 14  0 - 44 U/L Final  .  Alkaline Phosphatase 10/07/2018 77  38 - 126 U/L Final  . Total Bilirubin 10/07/2018 0.8  0.3 - 1.2 mg/dL Final  . GFR calc non Af Amer 10/07/2018 >60  >60 mL/min Final  . GFR calc Af Amer 10/07/2018 >60  >60 mL/min Final   Comment: (NOTE) The eGFR has been calculated using the CKD EPI equation. This calculation has not been validated in all clinical situations. eGFR's persistently <60 mL/min signify possible Chronic Kidney Disease.   Georgiann Hahn gap 10/07/2018 6  5 - 15 Final   Performed at [facility], [address] [place], [place] [address]     Pathology Orders Placed This Encounter  Procedures  . CBC with Differential/Platelet    Standing Status:   Future    Standing Expiration Date:   10/16/2020  . Comprehensive metabolic panel    Standing Status:   Future    Standing Expiration Date:   10/16/2020  . Lactate dehydrogenase    Standing Status:   Future    Standing Expiration Date:   10/16/2020       Zoila Shutter MD

## 2018-11-27 ENCOUNTER — Other Ambulatory Visit: Payer: Self-pay | Admitting: Obstetrics

## 2018-11-27 DIAGNOSIS — Z1231 Encounter for screening mammogram for malignant neoplasm of breast: Secondary | ICD-10-CM

## 2019-01-13 DIAGNOSIS — Z6821 Body mass index (BMI) 21.0-21.9, adult: Secondary | ICD-10-CM | POA: Diagnosis not present

## 2019-01-13 DIAGNOSIS — Z01419 Encounter for gynecological examination (general) (routine) without abnormal findings: Secondary | ICD-10-CM | POA: Diagnosis not present

## 2019-01-16 ENCOUNTER — Ambulatory Visit
Admission: RE | Admit: 2019-01-16 | Discharge: 2019-01-16 | Disposition: A | Discharge status: Home / Self Care | Payer: 59 | Source: Ambulatory Visit | Attending: Obstetrics | Admitting: Obstetrics

## 2019-01-16 DIAGNOSIS — Z1231 Encounter for screening mammogram for malignant neoplasm of breast: Secondary | ICD-10-CM

## 2019-01-30 DIAGNOSIS — Z0000 Encounter for general adult medical examination without abnormal findings: Secondary | ICD-10-CM | POA: Diagnosis not present

## 2019-01-30 DIAGNOSIS — Z1389 Encounter for screening for other disorder: Secondary | ICD-10-CM | POA: Diagnosis not present

## 2019-01-30 DIAGNOSIS — R002 Palpitations: Secondary | ICD-10-CM | POA: Diagnosis not present

## 2019-01-30 DIAGNOSIS — E8809 Other disorders of plasma-protein metabolism, not elsewhere classified: Secondary | ICD-10-CM | POA: Diagnosis not present

## 2019-01-30 DIAGNOSIS — R001 Bradycardia, unspecified: Secondary | ICD-10-CM | POA: Diagnosis not present

## 2019-10-09 ENCOUNTER — Other Ambulatory Visit (HOSPITAL_COMMUNITY): Payer: Self-pay | Admitting: *Deleted

## 2019-10-09 DIAGNOSIS — D693 Immune thrombocytopenic purpura: Secondary | ICD-10-CM

## 2019-10-12 ENCOUNTER — Inpatient Hospital Stay (HOSPITAL_COMMUNITY): Payer: 59 | Attending: Hematology

## 2019-10-19 ENCOUNTER — Ambulatory Visit (HOSPITAL_COMMUNITY): Payer: 59 | Admitting: Nurse Practitioner

## 2019-10-19 NOTE — Assessment & Plan Note (Deleted)
1.  Idiopathic thrombocytopenia: -Patient was diagnosed when she was 43 years old and has only had a few episodes in her life.  Her platelets dropped when she had a child and went to HELLP syndrome. -Patient has occasionally needed steroids which worked to increase her platelets. -She keeps a prescription for steroids on hand in case she needs it. -Labs on -Patient is advised to go to the ER if any unusual bruising or bleeding occurs. -We will keep checking her platelets every 6 months. -She will follow-up in 1 year with repeat labs.  2.  Sarcoidosis: -Patient follows up with her PCP and pulmonary as directed.  3.  History of HELLP syndrome: -This happened with her first child. -Labs done on

## 2019-12-24 ENCOUNTER — Other Ambulatory Visit: Payer: Self-pay | Admitting: Obstetrics

## 2019-12-24 DIAGNOSIS — Z1231 Encounter for screening mammogram for malignant neoplasm of breast: Secondary | ICD-10-CM

## 2020-02-04 ENCOUNTER — Ambulatory Visit: Payer: 59

## 2020-03-09 ENCOUNTER — Ambulatory Visit
Admission: RE | Admit: 2020-03-09 | Discharge: 2020-03-09 | Disposition: A | Discharge status: Home / Self Care | Payer: 59 | Source: Ambulatory Visit | Attending: Obstetrics | Admitting: Obstetrics

## 2020-03-09 ENCOUNTER — Other Ambulatory Visit: Payer: Self-pay

## 2020-03-09 DIAGNOSIS — Z1231 Encounter for screening mammogram for malignant neoplasm of breast: Secondary | ICD-10-CM

## 2021-03-17 ENCOUNTER — Other Ambulatory Visit: Payer: Self-pay | Admitting: Obstetrics

## 2021-03-17 DIAGNOSIS — Z1231 Encounter for screening mammogram for malignant neoplasm of breast: Secondary | ICD-10-CM

## 2021-05-09 ENCOUNTER — Other Ambulatory Visit: Payer: Self-pay

## 2021-05-09 ENCOUNTER — Ambulatory Visit
Admission: RE | Admit: 2021-05-09 | Discharge: 2021-05-09 | Disposition: A | Discharge status: Home / Self Care | Payer: No Typology Code available for payment source | Source: Ambulatory Visit | Attending: Obstetrics | Admitting: Obstetrics

## 2021-05-09 DIAGNOSIS — Z1231 Encounter for screening mammogram for malignant neoplasm of breast: Secondary | ICD-10-CM

## 2021-10-21 ENCOUNTER — Ambulatory Visit
Admission: RE | Admit: 2021-10-21 | Discharge: 2021-10-21 | Disposition: A | Discharge status: Home / Self Care | Payer: No Typology Code available for payment source | Source: Ambulatory Visit | Attending: Urgent Care | Admitting: Urgent Care

## 2021-10-21 ENCOUNTER — Other Ambulatory Visit: Payer: Self-pay

## 2021-10-21 VITALS — BP 143/94 | HR 76 | Temp 98.6°F | Resp 18

## 2021-10-21 DIAGNOSIS — R350 Frequency of micturition: Secondary | ICD-10-CM | POA: Diagnosis present

## 2021-10-21 DIAGNOSIS — N3000 Acute cystitis without hematuria: Secondary | ICD-10-CM | POA: Insufficient documentation

## 2021-10-21 DIAGNOSIS — R300 Dysuria: Secondary | ICD-10-CM | POA: Insufficient documentation

## 2021-10-21 DIAGNOSIS — Z8719 Personal history of other diseases of the digestive system: Secondary | ICD-10-CM | POA: Diagnosis not present

## 2021-10-21 LAB — POCT URINALYSIS DIP (MANUAL ENTRY)
Bilirubin, UA: NEGATIVE
Blood, UA: NEGATIVE
Glucose, UA: NEGATIVE mg/dL
Ketones, POC UA: NEGATIVE mg/dL
Leukocytes, UA: NEGATIVE
Nitrite, UA: POSITIVE — AB
Protein Ur, POC: NEGATIVE mg/dL
Spec Grav, UA: 1.015 (ref 1.010–1.025)
Urobilinogen, UA: 1 E.U./dL
pH, UA: 5.5 (ref 5.0–8.0)

## 2021-10-21 NOTE — Discharge Instructions (Signed)

## 2021-10-21 NOTE — ED Provider Notes (Signed)
[facility]   MRN: [id number] DOB: 13-Nov-1976  Subjective:   Emma Smith is a 45 y.o. female presenting for 1 week history of dysuria, urinary frequency now having low back pain. Has been on ciprofloxacin, then was switched to [facility] as prescribed by her PCP.  Just started Macrobid last night.  Admits that she does not drink water very well.  Drinks a lot of diet Pepsi.  She also has concerns about diverticulitis as she has been told that she has a history of diverticulosis.  Denies fever, bloody stools, abdominal pain.  No current facility-administered medications for this encounter.  Current Outpatient Medications:    acebutolol (SECTRAL) 200 MG capsule, Take 200 mg by mouth daily., Disp: , Rfl:    ALPRAZolam (XANAX) 0.5 MG tablet, Take 0.5 mg by mouth as needed for anxiety., Disp: , Rfl:    Calcium Carb-Cholecalciferol (CALCIUM 600+D) 600-800 MG-UNIT TABS, Take 1 tablet by mouth 2 (two) times daily., Disp: , Rfl:    Cetirizine-Pseudoephedrine (ZYRTEC-D PO), Take 1 tablet by mouth daily as needed (Allergies). , Disp: , Rfl:    ibuprofen (ADVIL,MOTRIN) 200 MG tablet, Take 400 mg by mouth every 6 (six) hours as needed for mild pain., Disp: , Rfl:    omeprazole (PRILOSEC) 20 MG capsule, , Disp: , Rfl: 1   No Known Allergies  Past Medical History:  Diagnosis Date   HELLP syndrome 06/26/2013   At end of first pregnancy   Hypertension    ITP (idiopathic thrombocytopenic purpura) 06/26/2013   In remission   Sarcoidosis 06/26/2013   Documented by Dr. Arlyce Dice in 2004 by mediastinoscopy and bronchoscopy after an abnormal chest x-ray     Past Surgical History:  Procedure Laterality Date   BONE MARROW BIOPSY     CESAREAN SECTION      Family History  Problem Relation Age of Onset   Hypertension Father    Breast cancer Neg Hx     Social History   Tobacco Use   Smoking status: Never   Smokeless tobacco: Never  Vaping Use   Vaping Use: Never used  Substance Use  Topics   Alcohol use: No   Drug use: No    ROS   Objective:   Vitals: BP (!) 143/94   Pulse 76   Temp 98.6 F (37 C) (Oral)   Resp 18   LMP 10/02/2021 (Approximate)   SpO2 96%   Physical Exam Constitutional:      General: She is not in acute distress.    Appearance: Normal appearance. She is well-developed. She is not ill-appearing, toxic-appearing or diaphoretic.  HENT:     Head: Normocephalic and atraumatic.     Nose: Nose normal.     Mouth/Throat:     Mouth: Mucous membranes are moist.     Pharynx: Oropharynx is clear.  Eyes:     General: No scleral icterus.       Right eye: No discharge.        Left eye: No discharge.     Extraocular Movements: Extraocular movements intact.     Conjunctiva/sclera: Conjunctivae normal.     Pupils: Pupils are equal, round, and reactive to light.  Cardiovascular:     Rate and Rhythm: Normal rate.  Pulmonary:     Effort: Pulmonary effort is normal.  Abdominal:     General: Bowel sounds are normal. There is no distension.     Palpations: Abdomen is soft. There is no mass.     Tenderness:  There is no abdominal tenderness. There is no right CVA tenderness, left CVA tenderness, guarding or rebound.  Skin:    General: Skin is warm and dry.  Neurological:     General: No focal deficit present.     Mental Status: She is alert and oriented to person, place, and time.  Psychiatric:        Mood and Affect: Mood normal.        Behavior: Behavior normal.        Thought Content: Thought content normal.        Judgment: Judgment normal.    Results for orders placed or performed during the hospital encounter of 10/21/21 (from the past 24 hour(s))  POCT urinalysis dipstick     Status: Abnormal   Collection Time: 10/21/21  1:18 PM  Result Value Ref Range   Color, UA orange (A) yellow   Clarity, UA clear clear   Glucose, UA negative negative mg/dL   Bilirubin, UA negative negative   Ketones, POC UA negative negative mg/dL   Spec Grav,  UA 1.015 1.010 - 1.025   Blood, UA negative negative   pH, UA 5.5 5.0 - 8.0   Protein Ur, POC negative negative mg/dL   Urobilinogen, UA 1.0 0.2 or 1.0 E.U./dL   Nitrite, UA Positive (A) Negative   Leukocytes, UA Negative Negative    Assessment and Plan :   PDMP not reviewed this encounter.  1. Acute cystitis without hematuria   2. Dysuria   3. Urinary frequency   4. History of diverticulosis     I do not see signs of an acute abdomen, diverticulitis.  Recommended that she continue Macrobid as she was prescribed this yesterday switched from ciprofloxacin.  Urine culture pending.  Emphasized need to hydrate much better with plain water and avoidance of urinary irritants.  Recommended follow-up with her PCP. Counseled patient on potential for adverse effects with medications prescribed/recommended today, ER and return-to-clinic precautions discussed, patient verbalized understanding.    Jaynee Eagles, PA-C 10/21/21 1414

## 2021-10-21 NOTE — ED Triage Notes (Signed)
Pt c/o of lower back pain that radiates to front abdomen x 1 week with urine frequency. She recently completed Cipro rx and has taken on Macrobid and Azo tab last evening. She believes she continues to have UTI infection.

## 2021-10-22 ENCOUNTER — Emergency Department (HOSPITAL_COMMUNITY)
Admission: EM | Admit: 2021-10-22 | Discharge: 2021-10-22 | Disposition: A | Discharge status: Home / Self Care | Payer: No Typology Code available for payment source | Attending: Emergency Medicine | Admitting: Emergency Medicine

## 2021-10-22 ENCOUNTER — Encounter (HOSPITAL_COMMUNITY): Payer: Self-pay | Admitting: *Deleted

## 2021-10-22 DIAGNOSIS — R3915 Urgency of urination: Secondary | ICD-10-CM | POA: Insufficient documentation

## 2021-10-22 DIAGNOSIS — R350 Frequency of micturition: Secondary | ICD-10-CM | POA: Diagnosis not present

## 2021-10-22 DIAGNOSIS — M545 Low back pain, unspecified: Secondary | ICD-10-CM | POA: Insufficient documentation

## 2021-10-22 DIAGNOSIS — I10 Essential (primary) hypertension: Secondary | ICD-10-CM | POA: Insufficient documentation

## 2021-10-22 LAB — CBC WITH DIFFERENTIAL/PLATELET
Abs Immature Granulocytes: 0.02 10*3/uL (ref 0.00–0.07)
Basophils Absolute: 0.1 10*3/uL (ref 0.0–0.1)
Basophils Relative: 1 %
Eosinophils Absolute: 0.1 10*3/uL (ref 0.0–0.5)
Eosinophils Relative: 1 %
HCT: 41.7 % (ref 36.0–46.0)
Hemoglobin: 14.1 g/dL (ref 12.0–15.0)
Immature Granulocytes: 0 %
Lymphocytes Relative: 27 %
Lymphs Abs: 2.4 10*3/uL (ref 0.7–4.0)
MCH: 29.9 pg (ref 26.0–34.0)
MCHC: 33.8 g/dL (ref 30.0–36.0)
MCV: 88.5 fL (ref 80.0–100.0)
Monocytes Absolute: 0.7 10*3/uL (ref 0.1–1.0)
Monocytes Relative: 7 %
Neutro Abs: 5.8 10*3/uL (ref 1.7–7.7)
Neutrophils Relative %: 64 %
Platelets: 241 10*3/uL (ref 150–400)
RBC: 4.71 MIL/uL (ref 3.87–5.11)
RDW: 12.2 % (ref 11.5–15.5)
WBC: 9 10*3/uL (ref 4.0–10.5)
nRBC: 0 % (ref 0.0–0.2)

## 2021-10-22 LAB — URINALYSIS, ROUTINE W REFLEX MICROSCOPIC
Bilirubin Urine: NEGATIVE
Glucose, UA: NEGATIVE mg/dL
Hgb urine dipstick: NEGATIVE
Ketones, ur: NEGATIVE mg/dL
Leukocytes,Ua: NEGATIVE
Nitrite: NEGATIVE
Protein, ur: NEGATIVE mg/dL
Specific Gravity, Urine: 1.004 — ABNORMAL LOW (ref 1.005–1.030)
pH: 6 (ref 5.0–8.0)

## 2021-10-22 LAB — BASIC METABOLIC PANEL
Anion gap: 10 (ref 5–15)
BUN: 13 mg/dL (ref 6–20)
CO2: 24 mmol/L (ref 22–32)
Calcium: 8.6 mg/dL — ABNORMAL LOW (ref 8.9–10.3)
Chloride: 101 mmol/L (ref 98–111)
Creatinine, Ser: 0.71 mg/dL (ref 0.44–1.00)
GFR, Estimated: >60 mL/min (ref >60)
Glucose, Bld: 91 mg/dL (ref 70–99)
Potassium: 3.9 mmol/L (ref 3.5–5.1)
Sodium: 135 mmol/L (ref 135–145)

## 2021-10-22 LAB — URINE CULTURE: Culture: NO GROWTH

## 2021-10-22 NOTE — ED Provider Notes (Signed)
[facility] EMERGENCY DEPARTMENT Provider Note   CSN: [id number] Arrival date & time: 10/22/21  1821     History Chief Complaint  Patient presents with   Back Pain    Emma Smith is a 45 y.o. female who presents to the ED complaining of lower back pain onset 1 week.  Patient lower back pain radiates to her lower abdomen. She was seen at Urgent Care on yesterday and had a urinalysis completed and informed to continue with Macrobid prescription.  Patient has taken 7 days of Cipro, AZO x2 days, and Macrobid since last night.  Patient has associated urinary frequency and urinary urgency. Pt is a pharmacist and notes decreased water intake.  Her last UTI was >6 months ago. She has tried prescription Cipro and Macrobid and AZO no relief of her symptoms.  Pt denies abdominal pain, nausea, vomiting, fever, chills, dysuria, hematuria, vaginal bleeding, or vaginal discharge. Pt had a tubal ligation.    Patient chart review: Patient was seen in urgent care on yesterday with similar concerns.  At that time no acute findings or acute abdomen noted.  Patient was informed to continue her prescription of Macrobid.  The history is provided by the patient. No language interpreter was used.      Past Medical History:  Diagnosis Date   HELLP syndrome 06/26/2013   At end of first pregnancy   Hypertension    ITP (idiopathic thrombocytopenic purpura) 06/26/2013   In remission   Sarcoidosis 06/26/2013   Documented by Dr. Burney in 2004 by mediastinoscopy and bronchoscopy after an abnormal chest x-ray    Patient Active Problem List   Diagnosis Date Noted   Chest pain 12/25/2015   Idiopathic thrombocytopenic purpura (HCC) 06/26/2013   HELLP syndrome in third trimester 06/26/2013   Sarcoidosis (HCC) 06/26/2013    Past Surgical History:  Procedure Laterality Date   BONE MARROW BIOPSY     CESAREAN SECTION       OB History   No obstetric history on file.     Family History  Problem Relation  Age of Onset   Hypertension Father    Breast cancer Neg Hx     Social History   Tobacco Use   Smoking status: Never   Smokeless tobacco: Never  Vaping Use   Vaping Use: Never used  Substance Use Topics   Alcohol use: No   Drug use: No    Home Medications Prior to Admission medications   Medication Sig Start Date End Date Taking? Authorizing Provider  acebutolol (SECTRAL) 200 MG capsule Take 200 mg by mouth daily.    [provider]  ALPRAZolam (XANAX) 0.5 MG tablet Take 0.5 mg by mouth as needed for anxiety.    [provider]  Calcium Carb-Cholecalciferol (CALCIUM 600+D) 600-800 MG-UNIT TABS Take 1 tablet by mouth 2 (two) times daily.    [provider]  Cetirizine-Pseudoephedrine (ZYRTEC-D PO) Take 1 tablet by mouth daily as needed (Allergies).     [provider]  ibuprofen (ADVIL,MOTRIN) 200 MG tablet Take 400 mg by mouth every 6 (six) hours as needed for mild pain.    [provider]  omeprazole (PRILOSEC) 20 MG capsule  08/03/16   [provider]    Allergies    Patient has no known allergies.  Review of Systems   Review of Systems  Constitutional:  Negative for chills and fever.  Respiratory:  Negative for shortness of breath.   Cardiovascular:  Negative for chest pain.    Gastrointestinal:  Negative for abdominal pain, nausea and vomiting.       -Bowel incontinence  Genitourinary:  Positive for frequency. Negative for vaginal bleeding and vaginal discharge.       -Bladder incontinence  Musculoskeletal:  Positive for back pain.  All other systems reviewed and are negative.  Physical Exam Updated Vital Signs BP (!) 139/91   Pulse 84   Temp 97.7 F (36.5 C) (Oral)   Resp 18   Ht 5' 6" (1.676 m)   Wt 55 kg   LMP 10/02/2021 (Approximate)   SpO2 100%   BMI 19.57 kg/m   Physical Exam Vitals and nursing note reviewed.  Constitutional:      General: She is not in acute distress.    Appearance: She is  well-developed. She is not diaphoretic.  HENT:     Head: Normocephalic and atraumatic.     Mouth/Throat:     Pharynx: No oropharyngeal exudate.  Eyes:     Conjunctiva/sclera: Conjunctivae normal.  Neck:     Comments: Full ROM without pain Cardiovascular:     Rate and Rhythm: Normal rate and regular rhythm.     Pulses: Normal pulses.     Heart sounds: Normal heart sounds.  Pulmonary:     Effort: Pulmonary effort is normal. No respiratory distress.     Breath sounds: Normal breath sounds.  Abdominal:     General: Bowel sounds are normal. There is no distension.     Palpations: Abdomen is soft.     Tenderness: There is no abdominal tenderness. There is no right CVA tenderness or left CVA tenderness.     Comments: No CVA tenderness bilaterally.  Musculoskeletal:     Cervical back: Normal range of motion and neck supple.     Comments: No midline tenderness to C, T, L, S spine or paraspinous muscles.   Lymphadenopathy:     Cervical: No cervical adenopathy.  Skin:    General: Skin is warm and dry.     Findings: No erythema or rash.  Neurological:     Mental Status: She is alert.     Comments: Speech is clear and goal oriented, follows commands.  5 out of 5 grip strength bilaterally.  Strength and sensation intact to bilateral upper and lower extremities. Moves extremities without ataxia, coordination intact.  Normal gait.  Psychiatric:        Behavior: Behavior normal.    ED Results / Procedures / Treatments   Labs (all labs ordered are listed, but only abnormal results are displayed) Labs Reviewed  URINALYSIS, ROUTINE W REFLEX MICROSCOPIC - Abnormal; Notable for the following components:      Result Value   Color, Urine STRAW (*)    Specific Gravity, Urine 1.004 (*)    All other components within normal limits  BASIC METABOLIC PANEL - Abnormal; Notable for the following components:   Calcium 8.6 (*)    All other components within normal limits  CBC WITH DIFFERENTIAL/PLATELET     EKG None  Radiology No results found.  Procedures Procedures   Medications Ordered in ED Medications - No data to display  ED Course  I have reviewed the triage vital signs and the nursing notes.  Pertinent labs & imaging results that were available during my care of the patient were reviewed by me and considered in my medical decision making (see chart for details).    MDM Rules/Calculators/A&P  Patient with back pain and urinary frequency.  Patient took a 7-day course of Cipro, started Macrobid yesterday, and Azo x2 days.  Patient recently diagnosed with UTI at urgent care on 10/21/2021.  Urine culture collected on 10/21/2021 with no growth.  No neurological deficits and normal neuro exam.  Patient is ambulatory.  No neurological deficits and normal neurological exam.  No CVA tenderness on exam.  Patient is ambulatory.  No loss of bowel or bladder control.  Vital signs stable, patient afebrile, oxygen saturation 100%. Differential diagnosis includes acute cystitis, pyelonephritis, or nephrolithiasis.  No need for imaging at this time, patient afebrile, no CVA tenderness on exam, patient well-appearing. Urinalysis today in the ED negative for infection.  CBC and BMP without acute findings.  No hematuria, signs of infection, suprapubic pain, fever, or change in kidney function on BMP, low suspicion for nephrolithiasis or pyelonephritis at this time.  Labs and urine today without findings for acute cystitis, likely due to to patient taking 2 rounds of antibiotics.  Supportive care and return precaution discussed.  Patient appears safe for discharge at this time.  Follow-up as indicated in discharge paperwork.   Final Clinical Impression(s) / ED Diagnoses Final diagnoses:  Urinary frequency  Acute bilateral low back pain, unspecified whether sciatica present    Rx / DC Orders ED Discharge Orders     None        Blue, Soijett A, PA-C 10/22/21  2003    Miller, Brian, MD 10/24/21 1858  

## 2021-10-22 NOTE — Discharge Instructions (Addendum)
You may follow-up with your GYN specialist next week if you are experiencing symptoms still.  Return to the ED if you are experiencing increasing or worsening abdominal pain, uncontrollable vomiting, nausea, blood in urine, or vaginal bleeding.

## 2021-10-22 NOTE — ED Triage Notes (Signed)
Back pain with urinary frequency

## 2022-05-08 ENCOUNTER — Other Ambulatory Visit: Payer: Self-pay | Admitting: Obstetrics

## 2022-05-08 DIAGNOSIS — Z1231 Encounter for screening mammogram for malignant neoplasm of breast: Secondary | ICD-10-CM

## 2022-05-30 ENCOUNTER — Ambulatory Visit
Admission: RE | Admit: 2022-05-30 | Discharge: 2022-05-30 | Disposition: A | Discharge status: Home / Self Care | Payer: 59 | Source: Ambulatory Visit | Attending: Obstetrics | Admitting: Obstetrics

## 2022-05-30 DIAGNOSIS — Z1231 Encounter for screening mammogram for malignant neoplasm of breast: Secondary | ICD-10-CM
